# Patient Record
Sex: Female | Born: 1960 | Race: White | Hispanic: No | Marital: Married | Smoking: Never smoker
Health system: Southern US, Community
[De-identification: ages and names within clinical notes are randomized; demographics above are authoritative.]

---

## 1983-06-20 HISTORY — PX: APPENDECTOMY: SHX54

## 1990-06-19 HISTORY — PX: ABDOMINAL HYSTERECTOMY: SHX81

## 1990-06-19 HISTORY — PX: BILATERAL SALPINGOOPHORECTOMY: SHX1223

## 1998-08-10 ENCOUNTER — Encounter: Payer: Self-pay | Admitting: Family Medicine

## 1998-08-10 LAB — CONVERTED CEMR LAB: Pap Smear: NORMAL

## 2006-08-14 ENCOUNTER — Ambulatory Visit: Payer: Self-pay | Admitting: Family Medicine

## 2007-03-12 ENCOUNTER — Encounter: Payer: Self-pay | Admitting: Family Medicine

## 2007-03-12 DIAGNOSIS — F329 Major depressive disorder, single episode, unspecified: Secondary | ICD-10-CM

## 2007-03-12 DIAGNOSIS — F3289 Other specified depressive episodes: Secondary | ICD-10-CM | POA: Insufficient documentation

## 2007-03-12 DIAGNOSIS — K5730 Diverticulosis of large intestine without perforation or abscess without bleeding: Secondary | ICD-10-CM | POA: Insufficient documentation

## 2007-03-25 ENCOUNTER — Ambulatory Visit: Payer: Self-pay | Admitting: Family Medicine

## 2007-03-25 DIAGNOSIS — K5732 Diverticulitis of large intestine without perforation or abscess without bleeding: Secondary | ICD-10-CM

## 2007-03-25 LAB — CONVERTED CEMR LAB
ALT: 20 units/L (ref 0–35)
BUN: 15 mg/dL (ref 6–23)
Basophils Absolute: 0 10*3/uL (ref 0.0–0.1)
Calcium: 9.3 mg/dL (ref 8.4–10.5)
Chloride: 106 meq/L (ref 96–112)
Eosinophils Absolute: 0.3 10*3/uL (ref 0.0–0.6)
Eosinophils Relative: 5.2 % — ABNORMAL HIGH (ref 0.0–5.0)
GFR calc Af Amer: 115 mL/min
GFR calc non Af Amer: 95 mL/min
HDL: 45 mg/dL (ref >39.0)
Lymphocytes Relative: 33.1 % (ref 12.0–46.0)
MCHC: 34.6 g/dL (ref 30.0–36.0)
MCV: 87.5 fL (ref 78.0–100.0)
Monocytes Relative: 11.2 % — ABNORMAL HIGH (ref 3.0–11.0)
Neutro Abs: 3.3 10*3/uL (ref 1.4–7.7)
Platelets: 318 10*3/uL (ref 150–400)
RBC: 4.42 M/uL (ref 3.87–5.11)
TSH: 1.52 microintl units/mL (ref 0.35–5.50)
Total CHOL/HDL Ratio: 3.4
Triglycerides: 43 mg/dL (ref 0–149)
WBC: 6.4 10*3/uL (ref 4.5–10.5)

## 2007-07-22 ENCOUNTER — Other Ambulatory Visit: Admission: RE | Admit: 2007-07-22 | Discharge: 2007-07-22 | Payer: Self-pay | Admitting: Family Medicine

## 2007-07-22 ENCOUNTER — Ambulatory Visit: Payer: Self-pay | Admitting: Family Medicine

## 2007-07-22 DIAGNOSIS — R030 Elevated blood-pressure reading, without diagnosis of hypertension: Secondary | ICD-10-CM

## 2007-07-22 LAB — CONVERTED CEMR LAB
Pap Smear: NORMAL
Pap Smear: NORMAL

## 2007-07-25 ENCOUNTER — Encounter (INDEPENDENT_AMBULATORY_CARE_PROVIDER_SITE_OTHER): Payer: Self-pay | Admitting: *Deleted

## 2007-07-30 ENCOUNTER — Ambulatory Visit: Payer: Self-pay | Admitting: Family Medicine

## 2007-08-06 ENCOUNTER — Ambulatory Visit: Payer: Self-pay | Admitting: Family Medicine

## 2007-08-13 ENCOUNTER — Ambulatory Visit: Payer: Self-pay | Admitting: Family Medicine

## 2007-08-23 ENCOUNTER — Ambulatory Visit: Payer: Self-pay | Admitting: Family Medicine

## 2007-08-23 DIAGNOSIS — I10 Essential (primary) hypertension: Secondary | ICD-10-CM | POA: Insufficient documentation

## 2007-09-13 ENCOUNTER — Ambulatory Visit: Payer: Self-pay | Admitting: Family Medicine

## 2007-09-14 LAB — CONVERTED CEMR LAB
CO2: 30 meq/L (ref 19–32)
Calcium: 9.4 mg/dL (ref 8.4–10.5)
Creatinine, Ser: 0.8 mg/dL (ref 0.4–1.2)
GFR calc Af Amer: 99 mL/min
Sodium: 141 meq/L (ref 135–145)

## 2009-08-31 ENCOUNTER — Ambulatory Visit: Payer: Self-pay | Admitting: Family Medicine

## 2009-08-31 DIAGNOSIS — R142 Eructation: Secondary | ICD-10-CM

## 2009-08-31 DIAGNOSIS — R141 Gas pain: Secondary | ICD-10-CM | POA: Insufficient documentation

## 2009-08-31 DIAGNOSIS — R143 Flatulence: Secondary | ICD-10-CM

## 2009-10-12 ENCOUNTER — Ambulatory Visit: Payer: Self-pay | Admitting: Family Medicine

## 2010-01-20 ENCOUNTER — Encounter (INDEPENDENT_AMBULATORY_CARE_PROVIDER_SITE_OTHER): Payer: Self-pay | Admitting: *Deleted

## 2010-05-08 LAB — HM PAP SMEAR

## 2010-07-19 NOTE — Assessment & Plan Note (Signed)
Summary: CHECK BP,SWOLLEN STOMACH/CLE   Vital Signs:  Patient profile:   50 year old female Height:      60.5 inches Weight:      145.75 pounds BMI:     28.10 Temp:     98.5 degrees F oral Pulse rate:   76 / minute Pulse rhythm:   regular BP sitting:   170 / 100  (left arm) Cuff size:   regular  Vitals Entered By: Sydell Axon LPN (August 31, 2009 8:30 AM) CC: BP has been elevated and swollen stomach, stopped her Lisinipril because she ran out and lost her job   History of Present Illness: Pt here with her mother for Htn. Lost her job so stopped her BP medicvation due to lack of money. She had had a cough from her Lisinopril and would just as well not go back top that medication. Her mother is on multiple meds, the only one she knows being generic Coreg.  The pt also c/o her stomach becoming swollen as the day goes on. She has no edema in the legs and has no SOB with the swelling. She drinks 2 glasses of milk a day asnd eats yogurt, not much cheese or ice cream. She has no gas and no cramping.  Problems Prior to Update: 1)  Hypertension, Benign Essential  (ICD-401.1) 2)  Health Maintenance Exam  (ICD-V70.0) 3)  Elevated Blood Pressure Without Diagnosis of Hypertension  (ICD-796.2) 4)  Diverticulitis, Colon w/o Hem  (ICD-562.11) 5)  Depression  (ICD-311) 6)  Menopause, Surgical, (ENDOMETRIOSIS)  (ICD-627.4) 7)  Diverticular Disease  (ICD-562.10) 8)  Hypercholesterolemia, Mild 234/ldl 144  (ICD-272.0)  Medications Prior to Update: 1)  Calcium 2)  Lisinopril 2.5 Mg  Tabs (Lisinopril) .... One Tab By Mouth Hs  Allergies: 1)  ! Keflex (Cephalexin) 2)  ! Sulfa  Physical Exam  General:  Well-developed,well-nourished,in no acute distress; alert,appropriate and cooperative throughout examination, looks healthy and comfortablae when seen.  Head:  Normocephalic and atraumatic without obvious abnormalities. No apparent alopecia or balding. Eyes:  Conjunctiva clear bilaterally.  Ears:   External ear exam shows no significant lesions or deformities.  Otoscopic examination reveals clear canals, tympanic membranes are intact bilaterally without bulging, retraction, inflammation or discharge. Hearing is grossly normal bilaterally. Nose:  External nasal examination shows no deformity or inflammation. Nasal mucosa are pink and moist without lesions or exudates. Mouth:  Oral mucosa and oropharynx without lesions or exudates.  Teeth in good repair. Neck:  No deformities, masses, or tenderness noted. Chest Wall:  No deformities, masses, or tenderness noted. Lungs:  Normal respiratory effort, chest expands symmetrically. Lungs are clear to auscultation, no crackles or wheezes. Heart:  Normal rate and regular rhythm. S1 and S2 normal without gallop, murmur, click, rub or other extra sounds. Abdomen:  Bowel sounds positive,abdomen soft and non-tender without masses, organomegaly or hernias noted. Tympanitic throughout with mild ventral hernia w/o defect felt.   Impression & Recommendations:  Problem # 1:  HYPERTENSION, BENIGN ESSENTIAL (ICD-401.1) Assessment Deteriorated High off medication. Will try Metoprolol 25mg  Succ at night. Recheck 6 weeks. The following medications were removed from the medication list:    Lisinopril 2.5 Mg Tabs (Lisinopril) ..... One tab by mouth hs Her updated medication list for this problem includes:    Metoprolol Succinate 25 Mg Xr24h-tab (Metoprolol succinate) ..... One tab by mouth at night  BP today: 170/100 Prior BP: 140/80 (08/23/2007)  Labs Reviewed: K+: 4.0 (09/13/2007) Creat: : 0.8 (09/13/2007)   Chol: 152 (  03/25/2007)   HDL: 45.0 (03/25/2007)   LDL: 98 (03/25/2007)   TG: 43 (03/25/2007)  Problem # 2:  ABDOMINAL DISTENSION (ICD-787.3) Assessment: New Trial of Mylanta and avoiding Lactose.  Complete Medication List: 1)  Calcium  2)  Metoprolol Succinate 25 Mg Xr24h-tab (Metoprolol succinate) .... One tab by mouth at night  Patient  Instructions: 1)  RTC 6 wks for BP check and abd swelling 2)  Start Mylanta with swelling and avoid milk and milk products. Prescriptions: METOPROLOL SUCCINATE 25 MG XR24H-TAB (METOPROLOL SUCCINATE) one tab by mouth at night  #30 x 12   Entered and Authorized by:   Shaune Leeks MD   Signed by:   Shaune Leeks MD on 08/31/2009   Method used:   Electronically to        [facility] [address] (retail)       [address]       [place], [place]  [address]       Ph: [phone]       Fax: [phone]   RxID:   [id number]   Current Allergies (reviewed today): ! KEFLEX (CEPHALEXIN) ! SULFA

## 2010-07-19 NOTE — Letter (Signed)
Summary: Nadara Eaton letter  [facility]  [address] [place], [place] [address]   Phone: [phone]  Fax: [phone]       01/20/2010 MRN: [id number]  Markitta Vanhoesen [address] [place], [place]  [address]  Dear Ms. [facility] [place] and [place] announce the retirement of Arta Silence, M.D., from full-time practice at the [facility] office effective December 16, 2009 and his plans of returning part-time.  It is important to Dr. Hetty Ely and to our practice that you understand that [facility] has seven physicians in our office for your health care needs.  We will continue to offer the same exceptional care that you have today.    Dr. Hetty Ely has spoken to many of you about his plans for retirement and returning part-time in the fall.   We will continue to work with you through the transition to schedule appointments for you in the office and meet the high standards that [facility] is committed to.   Again, it is with great pleasure that we share the news that Dr. Hetty Ely will return to [facility] in October of 2011 with a reduced schedule.    If you have any questions, or would like to request an appointment with one of our physicians, please call us at [phone] and press the option for Scheduling an appointment.  We take pleasure in providing you with excellent patient care and look forward to seeing you at your next office visit.  Our [facility]icians are:  Tillman Abide, M.D. Laurita Quint, M.D. Roxy Manns, M.D. Kerby Nora, M.D. Hannah Beat, M.D. Ruthe Mannan, M.D. We proudly welcomed Raechel Ache, M.D. and Eustaquio Boyden, M.D. to the practice in July/August 2011.  Sincerely,  [facility]

## 2010-07-19 NOTE — Assessment & Plan Note (Signed)
Summary: 6WK F/U FOR BP CHECK AND ABD SWELLING / LFW   Vital Signs:  Patient profile:   50 year old female Weight:      143.50 pounds Temp:     98.8 degrees F oral Pulse rate:   60 / minute Pulse rhythm:   regular BP sitting:   130 / 88  (left arm) Cuff size:   regular  Vitals Entered By: Sydell Axon LPN (October 12, 2009 10:25 AM) CC: 6 week follow-up on BP and abd swelling   History of Present Illness: Pt here for BP followup. She had some dizziness with the Metoprolol but that has gotten better. She still has some abd bloating that is not as bad, not burping much (not daily), not passing gas. She has had off and on pain in the stomach but nothing regular. Diet consists of Total whole grain or Fiber One for brfst, drinks OJ, at lunch often has [place] sandwich with wheat bread at night meat and veggies, water and cranberry juice during the day.   Problems Prior to Update: 1)  Abdominal Distension  (ICD-787.3) 2)  Hypertension, Benign Essential  (ICD-401.1) 3)  Health Maintenance Exam  (ICD-V70.0) 4)  Elevated Blood Pressure Without Diagnosis of Hypertension  (ICD-796.2) 5)  Diverticulitis, Colon w/o Hem  (ICD-562.11) 6)  Depression  (ICD-311) 7)  Menopause, Surgical, (ENDOMETRIOSIS)  (ICD-627.4) 8)  Diverticular Disease  (ICD-562.10) 9)  Hypercholesterolemia, Mild 234/ldl 144  (ICD-272.0)  Medications Prior to Update: 1)  Calcium 2)  Metoprolol Succinate 25 Mg Xr24h-Tab (Metoprolol Succinate) .... One Tab By Mouth At Night  Allergies: 1)  ! Keflex (Cephalexin) 2)  ! Sulfa  Physical Exam  General:  Well-developed,well-nourished,in no acute distress; alert,appropriate and cooperative throughout examination, looks healthy and comfortablae when seen.  Head:  Normocephalic and atraumatic without obvious abnormalities. No apparent alopecia or balding. Eyes:  Conjunctiva clear bilaterally.  Ears:  External ear exam shows no significant lesions or deformities.  Otoscopic  examination reveals clear canals, tympanic membranes are intact bilaterally without bulging, retraction, inflammation or discharge. Hearing is grossly normal bilaterally. Nose:  External nasal examination shows no deformity or inflammation. Nasal mucosa are pink and moist without lesions or exudates. Mouth:  Oral mucosa and oropharynx without lesions or exudates.  Teeth in good repair. Neck:  No deformities, masses, or tenderness noted. Lungs:  Normal respiratory effort, chest expands symmetrically. Lungs are clear to auscultation, no crackles or wheezes. Heart:  Normal rate and regular rhythm. S1 and S2 normal without gallop, murmur, click, rub or other extra sounds. Abdomen:  Bowel sounds positive,abdomen soft and non-tender without masses, organomegaly or hernias noted. Less disrtended than last visit but still slightly so. Tympanitic throughout with mild ventral hernia w/o defect felt.   Impression & Recommendations:  Problem # 1:  HYPERTENSION, BENIGN ESSENTIAL (ICD-401.1) Assessment Improved  Better, not great. Will continue to follow. See  back in late Fall. Her updated medication list for this problem includes:    Metoprolol Succinate 25 Mg Xr24h-tab (Metoprolol succinate) ..... One tab by mouth at night  BP today: 130/88 Prior BP: 170/100 (08/31/2009)  Labs Reviewed: K+: 4.0 (09/13/2007) Creat: : 0.8 (09/13/2007)   Chol: 152 (03/25/2007)   HDL: 45.0 (03/25/2007)   LDL: 98 (03/25/2007)   TG: 43 (03/25/2007)  Problem # 2:  ABDOMINAL DISTENSION (ICD-787.3) Assessment: Improved Mildly improved. Cont to experiment with foods as discussed. Poss start with eating just one food for a day or two and slowly adding as bloating  allows.  Complete Medication List: 1)  Calcium  2)  Metoprolol Succinate 25 Mg Xr24h-tab (Metoprolol succinate) .... One tab by mouth at night  Patient Instructions: 1)  RTC late Fall.  Current Allergies (reviewed today): ! KEFLEX (CEPHALEXIN) ! SULFA

## 2010-09-23 ENCOUNTER — Other Ambulatory Visit: Payer: Self-pay | Admitting: Family Medicine

## 2010-09-26 NOTE — Telephone Encounter (Signed)
Rx called to pharmacy

## 2012-02-06 ENCOUNTER — Ambulatory Visit (INDEPENDENT_AMBULATORY_CARE_PROVIDER_SITE_OTHER): Payer: BC Managed Care – PPO | Admitting: Internal Medicine

## 2012-02-06 ENCOUNTER — Encounter: Payer: Self-pay | Admitting: Internal Medicine

## 2012-02-06 VITALS — BP 132/84 | HR 78 | Temp 98.0°F | Resp 14 | Ht 60.5 in | Wt 145.8 lb

## 2012-02-06 DIAGNOSIS — R140 Abdominal distension (gaseous): Secondary | ICD-10-CM

## 2012-02-06 DIAGNOSIS — E663 Overweight: Secondary | ICD-10-CM | POA: Insufficient documentation

## 2012-02-06 DIAGNOSIS — Z6825 Body mass index (BMI) 25.0-25.9, adult: Secondary | ICD-10-CM

## 2012-02-06 DIAGNOSIS — R142 Eructation: Secondary | ICD-10-CM

## 2012-02-06 DIAGNOSIS — Z1239 Encounter for other screening for malignant neoplasm of breast: Secondary | ICD-10-CM

## 2012-02-06 LAB — HM COLONOSCOPY

## 2012-02-06 NOTE — Patient Instructions (Addendum)
Consider a Low Glycemic Index Diet and eating 6 smaller meals daily .  This frequent feeding stimulates your metabolism and the lower glycemic index foods will lower your blood sugars:   This is an example of my daily  "Low GI"  Diet:  All of the foods can be found at grocery stores and in bulk at [employer]Js  club   7 AM Breakfast:  Low carbohydrate Protein  Shakes (I recommend the EAS AdvantEdge "Carb Control" shakes  Or the low carb shakes by Atkins.   Both are available everywhere:  In  cases at BJs  Or in 4 packs at grocery stores and pharmacies  2.5 carbs  (Alternative is  a toasted Arnold's Sandwhich Thin w/ peanut butter, a "Bagel Thin" with cream cheese and salmon) or  a scrambled egg burrito made with a low carb tortilla .  Avoid cereal and bananas, oatmeal too unless the old fashioned kind that takes 30-40 minutes to prepare.  the rest is overly processed, has minimal fiber, and loaded with carbohydrates!   10 AM: Protein bar by Atkins (the snack size, under 200 cal.  There are many varieties , available widely again or in bulk in limited varieties at BJs)  Other so called "protein bars" tend to be loaded with carbohydrates.  Remember, in food advertising, the word "energy" is synonymous for " carbohydrate."  Lunch: sandwich of turkey, (or any lunchmeat or canned tuna), fresh avocado and cheese on a lower carbohydrate pita bread, flatbread, or tortilla . Ok to use mayonnaise. The bread is the only source or carbohydrate that can be decreased (Joseph's makes a pita bread and a flat bread  Are 50 cal and 4 net carbs ; Toufayan makes a low carb flatbread 100 cal and 9 net carbs  and  Mission makes a low carb whole wheat tortilla  210 cal and 6 net carbs)  3 PM:  Mid day :  Another protein bar,  Or a  cheese stick (100 cal, 0 carbs),  Or 1 ounce of  almonds, walnuts, pistachios, pecans, peanuts,  Macadamia nuts. Or a Dannon light n Fit greek yogurt, 80 cal 8 net carbs . Avoid "granola"; the dried  cranberries and raisins are loaded with carbohydrates.    6 PM  Dinner:  "mean and green:"  Meat/chicken/fish or a high protein legume; , with a green salad, and a low GI  Veggie (broccoli, cauliflower, green beans, spinach, brussel sprouts. Lima beans) : Avoid "Low fat dressings, [place] and [place]! They are loaded with sugar! Instead use ranch, vinagrette,  Blue cheese, etc  9 PM snack : Breyer's "low carb" fudgsicle or  ice cream bar (Carb Smart line), or  Weight Watcher's ice cream bar , or anouther "no sugar added" ice cream; or another protein shake or a serving of fresh fruit with whipped cream (Avoid bananas, pineapple, grapes  and watermelon on a regular basis because they are high in sugar)   Remember that snack Substitutions should be less than 15 to 20 carbs  Per serving. Remember to subtract fiber grams to get the "net carbs." 

## 2012-02-06 NOTE — Progress Notes (Signed)
Patient ID: Carrie Swanson, female   DOB: 1960-07-20, 51 y.o.   MRN: [id number]  Patient Active Problem List  Diagnosis  . HYPERTENSION, BENIGN ESSENTIAL  . DIVERTICULAR DISEASE  . ABDOMINAL DISTENSION  . Overweight (BMI 25.0-29.9)    Subjective:  CC:   Chief Complaint  Patient presents with  . Establish Care    HPI:   Carrie Swanson is a 51 y.o. female who presents as a new patient to establish primary care with the chief complaint of Overweight.  Abdominal bloating, becomes  worse by nightfall.  History of TAH/BSO secondary to endometriosis.  Not constipated. Moves bowels ,  Occasionally hard stools.  No regular pain relievers.,  Takes calcium daily and prn otc prilosec for reflux for the past year or two.,  Can go 3 days without it before symptoms return .  Occasional stabbing pain in the left lower quadrant, not daily. Weight has been stable.  Has been exercising using a treadmill 5 days per week for the past 4 months.    History reviewed. No pertinent past medical history.  Past Surgical History  Procedure Date  . Abdominal hysterectomy 1992    endometriosis  . Bilateral salpingoophorectomy 1992  . Appendectomy 1985  . Cesarean section     Family History  Problem Relation Age of Onset  . Hypertension Mother   . Heart disease Mother   . Mental illness Father     alzheimer  . Heart disease Father   . Diabetes Sister   . COPD Sister   . Cancer Paternal Grandmother     leukemia    History   Social History  . Marital Status: Married    Spouse Name: N/A    Number of Children: N/A  . Years of Education: N/A   Occupational History  . Not on file.   Social History Main Topics  . Smoking status: Never Smoker   . Smokeless tobacco: Never Used  . Alcohol Use: No  . Drug Use: No  . Sexually Active: Not on file   Other Topics Concern  . Not on file   Social History Narrative  . No narrative on file    Allergies  Allergen Reactions  . Cephalexin    REACTION: Hives  . Lisinopril     Causes cough  . Sulfonamide Derivatives     REACTION: Hives      Review of Systems:   The remainder of the review of systems was negative except those addressed in the HPI.     Objective:  BP 132/84  Pulse 78  Temp 98 F (36.7 C) (Oral)  Resp 14  Ht 5' 0.5" (1.537 m)  Wt 145 lb 12 oz (66.112 kg)  BMI 28.00 kg/m2  SpO2 93%  General appearance: alert, cooperative and appears stated age Ears: normal TM's and external ear canals both ears Throat: lips, mucosa, and tongue normal; teeth and gums normal Neck: no adenopathy, no carotid bruit, supple, symmetrical, trachea midline and thyroid not enlarged, symmetric, no tenderness/mass/nodules Back: symmetric, no curvature. ROM normal. No CVA tenderness. Lungs: clear to auscultation bilaterally Heart: regular rate and rhythm, S1, S2 normal, no murmur, click, rub or gallop Abdomen: soft, non-tender; bowel sounds normal; no masses,  no organomegaly Pulses: 2+ and symmetric Skin: Skin color, texture, turgor normal. No rashes or lesions Lymph nodes: Cervical, supraclavicular, and axillary nodes normal.  Assessment and Plan:  ABDOMINAL DISTENSION Her exam is consistent with central adiposity, but since she has LUQ pain  on exam and will order an abdominal ultrasound to rule out splenomegaly   Updated Medication List Outpatient Encounter Prescriptions as of 02/06/2012  Medication Sig Dispense Refill  . Calcium Carb-Cholecalciferol (CALCIUM + D3) 600-200 MG-UNIT TABS Take by mouth.      . DISCONTD: metoprolol succinate (TOPROL-XL) 25 MG 24 hr tablet take 1 tablet by mouth every evening  30 tablet  0     Orders Placed This Encounter  Procedures  . MM Digital Screening  . US Abdomen Complete    Return in about 3 months (around 05/08/2012).

## 2012-02-06 NOTE — Assessment & Plan Note (Signed)
Her exam is consistent with central adiposity, but since she has LUQ pain on exam and will order an abdominal ultrasound to rule out splenomegaly

## 2012-02-07 ENCOUNTER — Telehealth: Payer: Self-pay | Admitting: Internal Medicine

## 2012-02-07 ENCOUNTER — Ambulatory Visit: Payer: Self-pay | Admitting: Internal Medicine

## 2012-02-07 DIAGNOSIS — K7689 Other specified diseases of liver: Secondary | ICD-10-CM

## 2012-02-07 NOTE — Telephone Encounter (Signed)
Her abdominal ultrasound was normal except for a small (1 cm) nodule in the liver which they could not really characterize as a cyst or a mass .  They are recommending an MRI of the abdomen for a better idea of whether it is anything other than a cyst. I will order.

## 2012-02-08 NOTE — Telephone Encounter (Signed)
Patient notified

## 2012-02-14 ENCOUNTER — Other Ambulatory Visit: Payer: Self-pay | Admitting: Internal Medicine

## 2012-02-14 DIAGNOSIS — Z1211 Encounter for screening for malignant neoplasm of colon: Secondary | ICD-10-CM

## 2012-02-15 ENCOUNTER — Ambulatory Visit: Payer: Self-pay | Admitting: Internal Medicine

## 2012-02-15 ENCOUNTER — Other Ambulatory Visit: Payer: BC Managed Care – PPO

## 2012-02-15 DIAGNOSIS — Z1211 Encounter for screening for malignant neoplasm of colon: Secondary | ICD-10-CM

## 2012-02-15 LAB — FECAL OCCULT BLOOD, IMMUNOCHEMICAL: Fecal Occult Bld: NEGATIVE

## 2012-02-16 ENCOUNTER — Encounter: Payer: Self-pay | Admitting: Internal Medicine

## 2012-02-16 ENCOUNTER — Telehealth: Payer: Self-pay | Admitting: Internal Medicine

## 2012-02-16 NOTE — Telephone Encounter (Signed)
Pt called to get results of mri that was done yesterday [facility]

## 2012-02-16 NOTE — Telephone Encounter (Signed)
We have not received results yet, we will call patient when we receive them.

## 2012-02-20 ENCOUNTER — Telehealth: Payer: Self-pay | Admitting: Internal Medicine

## 2012-02-20 NOTE — Telephone Encounter (Signed)
Left message asking patient to return call. 

## 2012-02-20 NOTE — Telephone Encounter (Signed)
The MIR of her liver showed that the nodule we saw on the CT scan is just a cyst.  Nothing to worry about.  No further workup required.

## 2012-02-26 ENCOUNTER — Ambulatory Visit: Payer: Self-pay | Admitting: Internal Medicine

## 2012-02-27 ENCOUNTER — Ambulatory Visit: Payer: Self-pay | Admitting: Internal Medicine

## 2012-02-28 ENCOUNTER — Telehealth: Payer: Self-pay | Admitting: Internal Medicine

## 2012-02-28 NOTE — Telephone Encounter (Signed)
Left message on cell # asking patient to call back.

## 2012-02-28 NOTE — Telephone Encounter (Signed)
Her mammogram was read as abnormal on the right, and  They are requesting images to make sure it is just a skin tag.  Has she been contacted?

## 2012-02-28 NOTE — Telephone Encounter (Signed)
Patient notified.  She stated they contacted her this morning.

## 2012-02-29 ENCOUNTER — Telehealth: Payer: Self-pay | Admitting: Internal Medicine

## 2012-02-29 ENCOUNTER — Ambulatory Visit: Payer: Self-pay | Admitting: Internal Medicine

## 2012-02-29 NOTE — Telephone Encounter (Signed)
The area of concern on the right breast was indeed a skin tag. Nothing to worry about. Repeat annual mammogram in 1 year.

## 2012-03-01 NOTE — Telephone Encounter (Signed)
Patient is aware 

## 2012-03-07 ENCOUNTER — Encounter: Payer: Self-pay | Admitting: Internal Medicine

## 2012-03-08 ENCOUNTER — Encounter: Payer: Self-pay | Admitting: Internal Medicine

## 2012-05-02 ENCOUNTER — Other Ambulatory Visit: Payer: Self-pay | Admitting: *Deleted

## 2012-05-03 ENCOUNTER — Other Ambulatory Visit (INDEPENDENT_AMBULATORY_CARE_PROVIDER_SITE_OTHER): Payer: BC Managed Care – PPO

## 2012-05-03 DIAGNOSIS — E670 Hypervitaminosis A: Secondary | ICD-10-CM

## 2012-05-03 DIAGNOSIS — Z13220 Encounter for screening for lipoid disorders: Secondary | ICD-10-CM

## 2012-05-03 DIAGNOSIS — E663 Overweight: Secondary | ICD-10-CM

## 2012-05-03 LAB — COMPREHENSIVE METABOLIC PANEL
BUN: 14 mg/dL (ref 6–23)
CO2: 29 mEq/L (ref 19–32)
Creatinine, Ser: 0.8 mg/dL (ref 0.4–1.2)
GFR: 83.56 mL/min (ref >60.00)
Glucose, Bld: 101 mg/dL — ABNORMAL HIGH (ref 70–99)
Total Bilirubin: 0.7 mg/dL (ref 0.3–1.2)

## 2012-05-03 LAB — LIPID PANEL
HDL: 54.2 mg/dL (ref >39.00)
Triglycerides: 88 mg/dL (ref 0.0–149.0)

## 2012-05-03 LAB — TSH: TSH: 1.86 u[IU]/mL (ref 0.35–5.50)

## 2012-05-03 LAB — HEMOGLOBIN A1C: Hgb A1c MFr Bld: 6.1 % (ref 4.6–6.5)

## 2012-05-08 ENCOUNTER — Encounter: Payer: Self-pay | Admitting: Internal Medicine

## 2012-05-08 ENCOUNTER — Ambulatory Visit (INDEPENDENT_AMBULATORY_CARE_PROVIDER_SITE_OTHER): Payer: BC Managed Care – PPO | Admitting: Internal Medicine

## 2012-05-08 VITALS — BP 122/68 | HR 82 | Temp 98.4°F | Resp 12 | Ht 60.5 in | Wt 141.2 lb

## 2012-05-08 DIAGNOSIS — Z0000 Encounter for general adult medical examination without abnormal findings: Secondary | ICD-10-CM

## 2012-05-08 DIAGNOSIS — E663 Overweight: Secondary | ICD-10-CM

## 2012-05-08 DIAGNOSIS — Z124 Encounter for screening for malignant neoplasm of cervix: Secondary | ICD-10-CM

## 2012-05-08 NOTE — Patient Instructions (Addendum)
This is  my version of a  "Low GI"  Diet:  All of the foods can be found at grocery stores and in bulk at [employer].  The Atkins protein bars and shakes are available in more varieties at [employer], [employer] and [employer].     7 AM Breakfast:  Low carbohydrate Protein  Shakes (I recommend the EAS AdvantEdge "Carb Control" shakes  Or the low carb shakes by Atkins.   Both are available everywhere:  In  cases at BJs  Or in 4 packs at grocery stores and pharmacies  2.5 carbs  (Alternative is  a toasted Arnold's Sandwhich Thin w/ peanut butter, a "Bagel Thin" with cream cheese and salmon) or  a scrambled egg burrito made with a low carb tortilla .  Avoid cereal and bananas, oatmeal too unless you are cooking the old fashioned kind that takes 30-40 minutes to prepare.  the rest is overly processed, has minimal fiber, and is loaded with carbohydrates!   10 AM: Protein bar by Atkins (the snack size, under 200 cal).  There are many varieties , available widely again or in bulk in limited varieties at BJs)  Other so called "protein bars" tend to be loaded with carbohydrates.  Remember, in food advertising, the word "energy" is synonymous for " carbohydrate."  Lunch: sandwich of turkey, (or any lunchmeat, grilled meat or canned tuna), fresh avocado, mayonnaise  and cheese on a lower carbohydrate pita bread, flatbread, or tortilla . Ok to use regular mayonnaise. The bread is the only source or carbohydrate that can be decreased (Joseph's makes a pita bread and a flat bread that are 50 cal and 4 net carbs ; Toufayan makes a low carb flatbread that's 100 cal and 9 net carbs  and  Mission makes a low carb whole wheat tortilla  That is 210 cal and 6 net carbs)  3 PM:  Mid day :  Another protein bar,  Or a  cheese stick (100 cal, 0 carbs),  Or 1 ounce of  almonds, walnuts, pistachios, pecans, peanuts,  Macadamia nuts. Or a Dannon light n Fit greek yogurt, 80 cal 8 net carbs . Avoid "granola"; the dried cranberries and  raisins are loaded with carbohydrates. Mixed nuts ok if no raisins or cranberries or dried fruit.      6 PM  Dinner:  "mean and green:"  Meat/chicken/fish or a high protein legume; , with a green salad, and a low GI  Veggie (broccoli, cauliflower, green beans, spinach, brussel sprouts. Lima beans) : Avoid "Low fat dressings, as well as [place] and [place]! They are loaded with sugar! Instead use ranch, vinagrette,  Blue cheese, etc.  There is a low carb pasta by Dreamfield's available at [employer] grocery that is acceptable and tastes great. Try Michel Angel's chicken piccata over low carb pasta. The chicken dish is 0 carbs, and can be found in frozen section at BJs and [employer]. Also try Aaron Sanchez's "Carnitas" (pulled pork, no sauce,  0 carbs) and his pot roast.   both are in the refrigerated section at BJs   9 PM snack : Breyer's "low carb" fudgsicle or  ice cream bar (Carb Smart line), or  Weight Watcher's ice cream bar , or another "no sugar added" ice cream;a serving of fresh berries/cherries with whipped cream (Avoid bananas, pineapple, grapes  and watermelon on a regular basis because they are high in sugar)   Remember that snack Substitutions should be less than 15 to   20 carbs  Per serving. Remember to subtract fiber grams and sugar alcohols to get the "net carbs."   

## 2012-05-08 NOTE — Progress Notes (Signed)
Patient ID: YVANNA VIDAS, female   DOB: Aug 10, 1960, 51 y.o.   MRN: [id number]  Subjective:     SU DUMA is a 51 y.o. female and is here for a comprehensive physical exam. The patient reports no problems. She is a medication auditor at [employer] and is concerned about job security due to economic pressures inflicted on the facility by [employer] coverage changes.  She denies insomnia, but is having frequent tension headaches  Which start in neck.     History   Social History  . Marital Status: Married    Spouse Name: N/A    Number of Children: N/A  . Years of Education: N/A   Occupational History  . Not on file.   Social History Main Topics  . Smoking status: Never Smoker   . Smokeless tobacco: Never Used  . Alcohol Use: No  . Drug Use: No  . Sexually Active: Not on file   Other Topics Concern  . Not on file   Social History Narrative  . No narrative on file   Health Maintenance  Topic Date Due  . Influenza Vaccine  02/18/2012  . Pap Smear  05/08/2013  . Mammogram  03/08/2014  . Tetanus/tdap  07/21/2017  . Colonoscopy  02/05/2022    The following portions of the patient's history were reviewed and updated as appropriate: allergies, current medications, past family history, past medical history, past social history, past surgical history and problem list.  Review of Systems A comprehensive review of systems was negative.   Objective:        BP 122/68  Pulse 82  Temp 98.4 F (36.9 C) (Oral)  Resp 12  Ht 5' 0.5" (1.537 m)  Wt 141 lb 4 oz (64.071 kg)  BMI 27.13 kg/m2  SpO2 94%  General Appearance:    Alert, cooperative, no distress, appears stated age  Head:    Normocephalic, without obvious abnormality, atraumatic  Eyes:    PERRL, conjunctiva/corneas clear, EOM's intact, fundi    benign, both eyes  Ears:    Normal TM's and external ear canals, both ears  Nose:   Nares normal, septum midline, mucosa normal, no drainage    or sinus tenderness    Throat:   Lips, mucosa, and tongue normal; teeth and gums normal  Neck:   Supple, symmetrical, trachea midline, no adenopathy;    thyroid:  no enlargement/tenderness/nodules; no carotid   bruit or JVD  Back:     Symmetric, no curvature, ROM normal, no CVA tenderness  Lungs:     Clear to auscultation bilaterally, respirations unlabored  Chest Wall:    No tenderness or deformity   Heart:    Regular rate and rhythm, S1 and S2 normal, no murmur, rub   or gallop  Breast Exam:    No tenderness, masses, or nipple abnormality  Abdomen:     Soft, non-tender, bowel sounds active all four quadrants,    no masses, no organomegaly     Extremities:   Extremities normal, atraumatic, no cyanosis or edema  Pulses:   2+ and symmetric all extremities  Skin:   Skin color, texture, turgor normal, no rashes or lesions  Lymph nodes:   Cervical, supraclavicular, and axillary nodes normal  Neurologic:   CNII-XII intact, normal strength, sensation and reflexes    throughout       Plan:     Routine general medical examination at a health care facility Breast and non gyn exam was normal  today.  Overweight (BMI 25.0-29.9) improving with exercise.  No signs of diabetes or hypothryoidism.    Updated Medication List Outpatient Encounter Prescriptions as of 05/08/2012  Medication Sig Dispense Refill  . Calcium Carb-Cholecalciferol (CALCIUM + D3) 600-200 MG-UNIT TABS Take by mouth.

## 2012-05-11 ENCOUNTER — Encounter: Payer: Self-pay | Admitting: Internal Medicine

## 2012-05-11 DIAGNOSIS — Z0000 Encounter for general adult medical examination without abnormal findings: Secondary | ICD-10-CM | POA: Insufficient documentation

## 2012-05-11 NOTE — Assessment & Plan Note (Signed)
Breast and non gyn exam was normal today.

## 2012-05-11 NOTE — Assessment & Plan Note (Signed)
improving with exercise.  No signs of diabetes or hypothryoidism.

## 2012-05-21 ENCOUNTER — Telehealth: Payer: Self-pay | Admitting: Internal Medicine

## 2012-05-21 NOTE — Telephone Encounter (Signed)
Patient Information:  Caller Name: Carrie Swanson  Phone: [phone]  Patient: Carrie Swanson  Gender: Female  DOB: 04-02-1961  Age: 51 Years  PCP: Duncan Dull (Adults only)  Pregnant: No   Symptoms  Reason For Call & Symptoms: Chills, Aches and pain in head, neck, back, legs, Mild sinus congestion  Reviewed Health History In EMR: Yes  Reviewed Medications In EMR: Yes  Reviewed Allergies In EMR: Yes  Reviewed Surgeries / Procedures: Yes  Date of Onset of Symptoms: 05/20/2012  Treatments Tried: Ibuprofen for pain  Treatments Tried Worked: Yes OB:  LMP: Unknown  Guideline(s) Used:  Headache  Influenza - Seasonal  Disposition Per Guideline:   Home Care  Reason For Disposition Reached:   Probable influenza with no complications and not HIGH RISK  Advice Given:  Treating the Symptoms of Flu  Fever, Muscle Aches, and Headache: For fever more than 101 F (38.3 C), muscle aches, and headaches, take acetaminophen every 4-6 hours (Adults 650 mg) OR ibuprofen every 6-8 hours (Adults 400-600 mg).  Hydrate: Drink extra liquids. If the air in your home is dry, use a humidifier.  Expected Course  : The fever lasts 2-3 days, the runny nose 5-10 days, and the cough 2-3 weeks.  Call Back If:  Fever lasts more than 3 days  Runny nose lasts more than 10 days  Cough lasts more than 3 weeks  You become short of breath or worse.  Office Follow Up:  Does the office need to follow up with this patient?: No  Instructions For The Office: N/A  RN Note:  Having Headache pain 4-7 on pain scale. Patient states she feels like she may be coming down with the flu.

## 2012-05-21 NOTE — Telephone Encounter (Signed)
Left detailed message on patient vm as instructed and advised patient to schedule appt to have rapid flu test done.

## 2012-05-21 NOTE — Telephone Encounter (Signed)
Unlikely to be the FLu unless she is having T > 100 but if she would like to come in for a rapid influenza test,  RN visit, I will review and if positive will call in Tamiflu

## 2012-05-21 NOTE — Telephone Encounter (Signed)
Routing nurse notes

## 2012-08-03 ENCOUNTER — Other Ambulatory Visit: Payer: Self-pay

## 2012-08-09 ENCOUNTER — Telehealth: Payer: Self-pay | Admitting: Internal Medicine

## 2012-08-09 NOTE — Telephone Encounter (Signed)
Pt states she was in for her physical in 11/13.  Pt received a bill and her insurance states they are not paying for the physical due to the code used.  Patient has already spoken to billing and they were not able to help her, stated that the code will have to be revised by Dr. Darrick Huntsman.  Please advise.

## 2013-04-24 ENCOUNTER — Other Ambulatory Visit: Payer: Self-pay

## 2013-05-13 ENCOUNTER — Ambulatory Visit (INDEPENDENT_AMBULATORY_CARE_PROVIDER_SITE_OTHER): Payer: BC Managed Care – PPO | Admitting: Internal Medicine

## 2013-05-13 ENCOUNTER — Encounter: Payer: Self-pay | Admitting: Internal Medicine

## 2013-05-13 VITALS — BP 138/80 | HR 63 | Temp 97.3°F | Resp 12 | Ht 61.0 in | Wt 135.8 lb

## 2013-05-13 DIAGNOSIS — Z9079 Acquired absence of other genital organ(s): Secondary | ICD-10-CM

## 2013-05-13 DIAGNOSIS — E785 Hyperlipidemia, unspecified: Secondary | ICD-10-CM

## 2013-05-13 DIAGNOSIS — Z13820 Encounter for screening for osteoporosis: Secondary | ICD-10-CM

## 2013-05-13 DIAGNOSIS — Z90710 Acquired absence of both cervix and uterus: Secondary | ICD-10-CM | POA: Insufficient documentation

## 2013-05-13 DIAGNOSIS — E559 Vitamin D deficiency, unspecified: Secondary | ICD-10-CM

## 2013-05-13 DIAGNOSIS — R5381 Other malaise: Secondary | ICD-10-CM

## 2013-05-13 DIAGNOSIS — Z0000 Encounter for general adult medical examination without abnormal findings: Secondary | ICD-10-CM

## 2013-05-13 DIAGNOSIS — E663 Overweight: Secondary | ICD-10-CM

## 2013-05-13 DIAGNOSIS — G43909 Migraine, unspecified, not intractable, without status migrainosus: Secondary | ICD-10-CM | POA: Insufficient documentation

## 2013-05-13 DIAGNOSIS — Z1211 Encounter for screening for malignant neoplasm of colon: Secondary | ICD-10-CM

## 2013-05-13 DIAGNOSIS — Z6825 Body mass index (BMI) 25.0-25.9, adult: Secondary | ICD-10-CM

## 2013-05-13 MED ORDER — AMITRIPTYLINE HCL 25 MG PO TABS: 25.0000 mg | ORAL_TABLET | Freq: Every day | ORAL | Status: DC

## 2013-05-13 MED ORDER — ELETRIPTAN HYDROBROMIDE 40 MG PO TABS: 40.0000 mg | ORAL_TABLET | ORAL | Status: DC | PRN

## 2013-05-13 NOTE — Patient Instructions (Addendum)
Trial of amitryptiline 25 mfg at bedtime for prevention of migraines.  Can increase to 50 mg if needed after 2 weeks.  Trial of relpax to take for migraine headaches.  One tablet at onset.  May repeat in 2 hours if headache retursn or does not resolve.  (max 2 doses/24 hr )   You had your annual  wellness exam today  We will schedule your bone density test soon.  Please use the stool kit to send [place] back a sample to test for blood.  This is your colon CA screening test.   Make an appt for fasting labs  We will contact you with the bloodwork results  3D mammogram due tin September

## 2013-05-13 NOTE — Progress Notes (Signed)
Pre-visit discussion using our clinic review tool. No additional management support is needed unless otherwise documented below in the visit note.  

## 2013-05-14 IMAGING — US ABDOMEN ULTRASOUND LIMITED
1 series · 13 of 25 positions shown · non-contrast
Comparison: none

REASON FOR EXAM: abd distention
COMMENTS:

PROCEDURE:     QUIRIJN - QUIRIJN ABDOMEN UPPER GENERAL  - February 07, 2012  [DATE]
RESULT:     Comparison: None
TECHNIQUE: Multiple gray-scale and color-flow Doppler images of the abdomen
are presented for review.

[Series 1: abdomen ultrasound limited · 0.31mm/px · 13 of 80 slices shown]
[im 1/80]
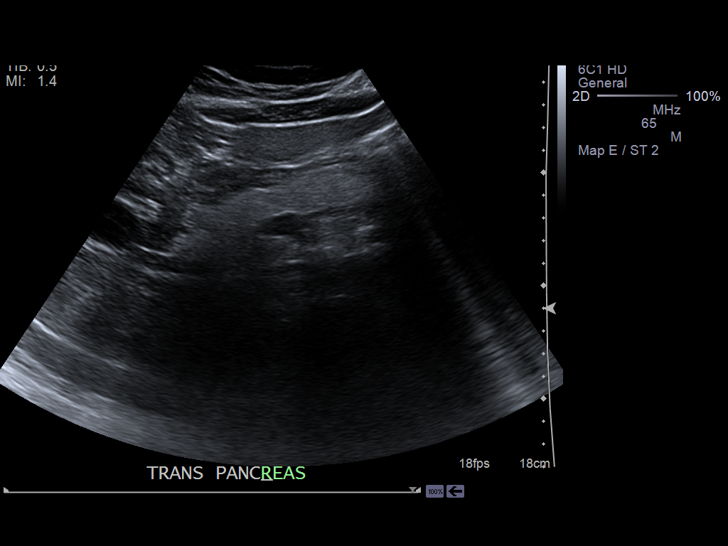
[im 7/80]
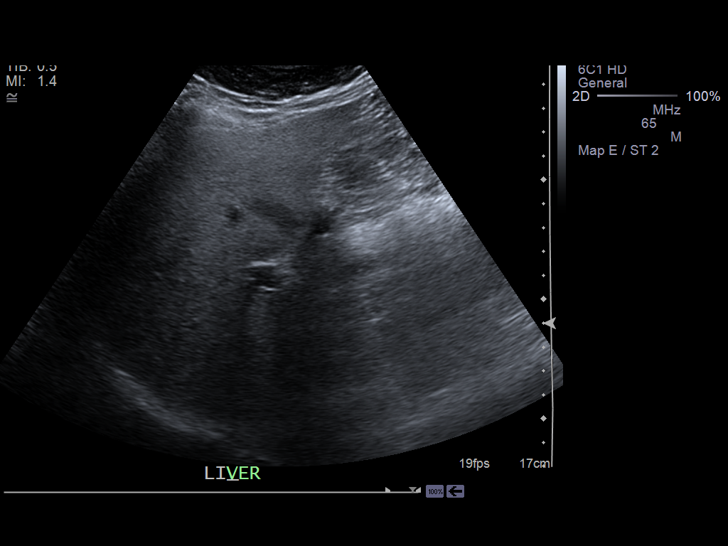
[im 14/80]
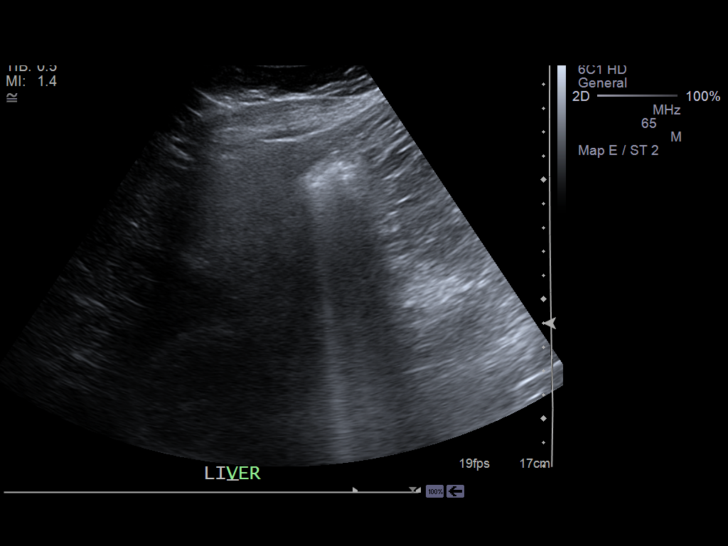
[im 20/80]
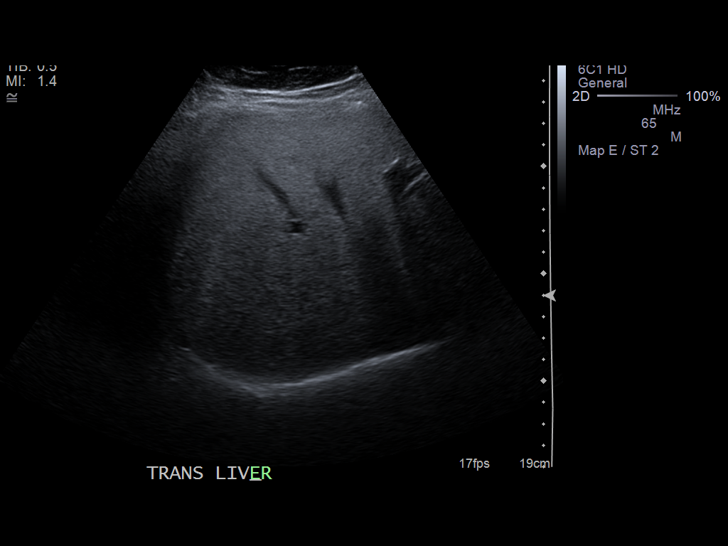
[im 27/80]
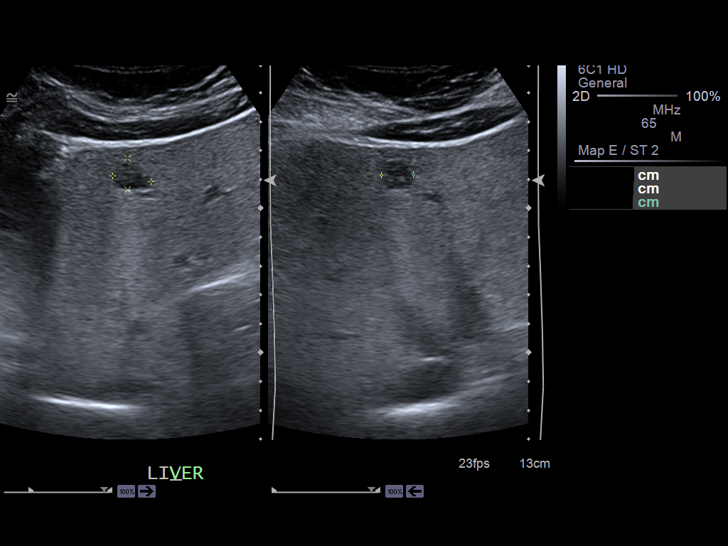
[im 33/80]
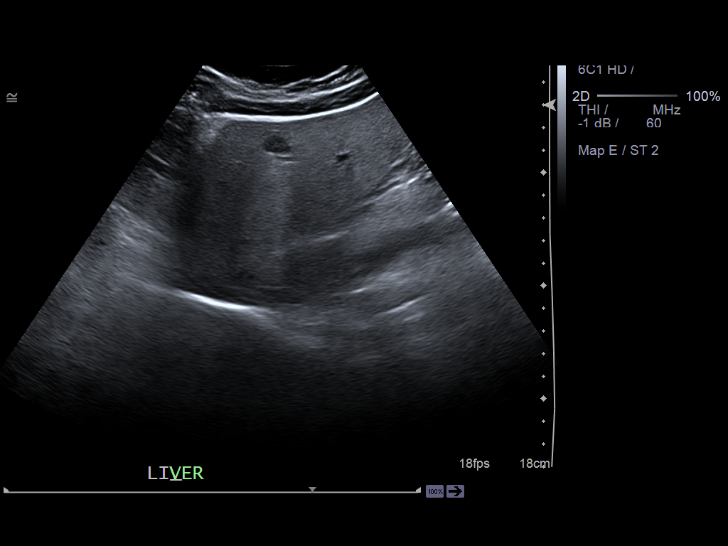
[im 40/80]
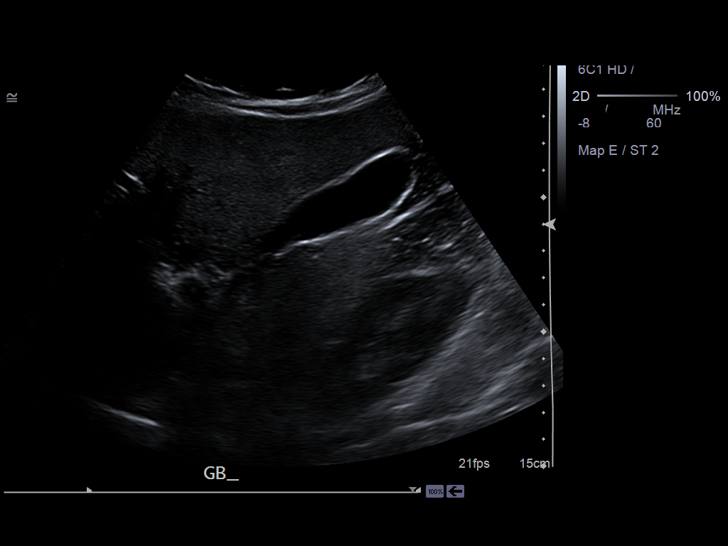
[im 47/80]
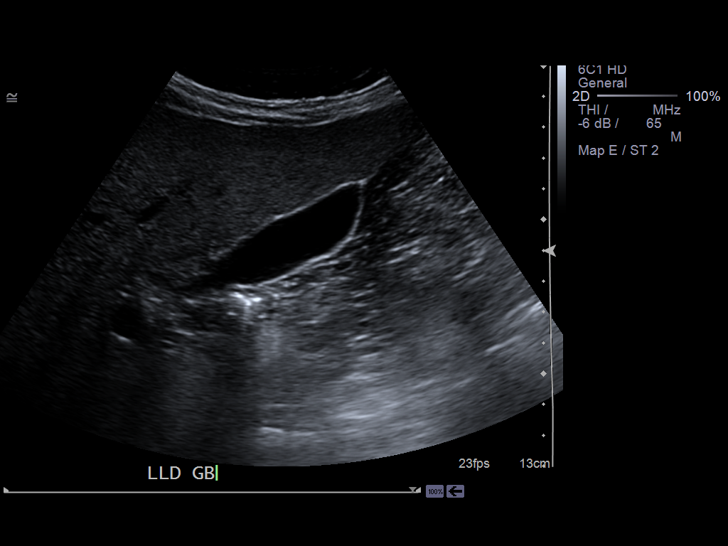
[im 53/80]
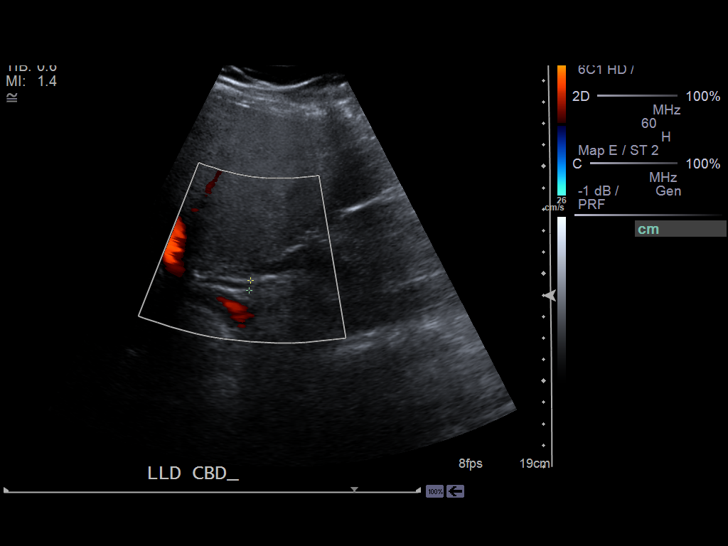
[im 60/80]
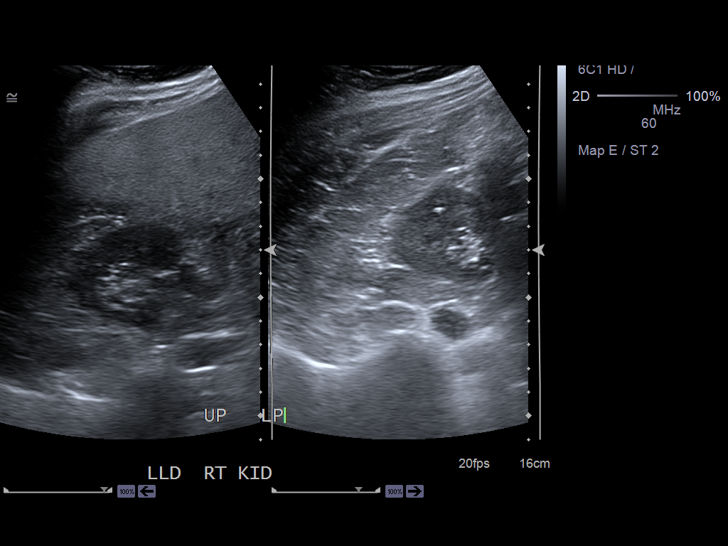
[im 66/80]
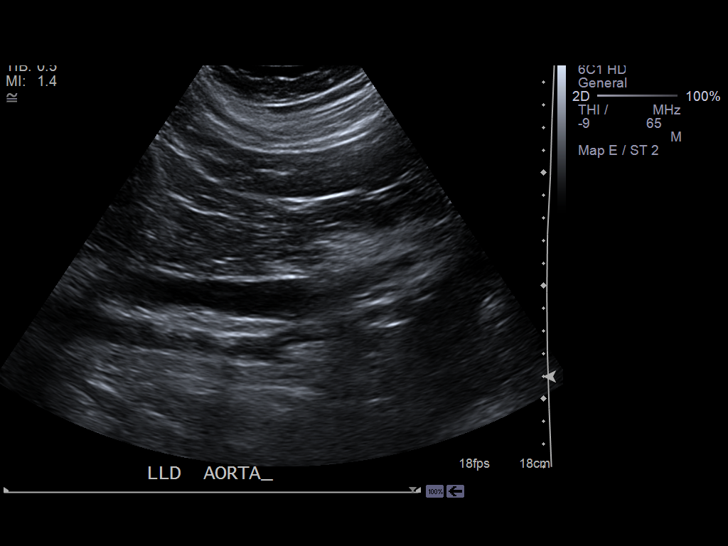
[im 73/80]
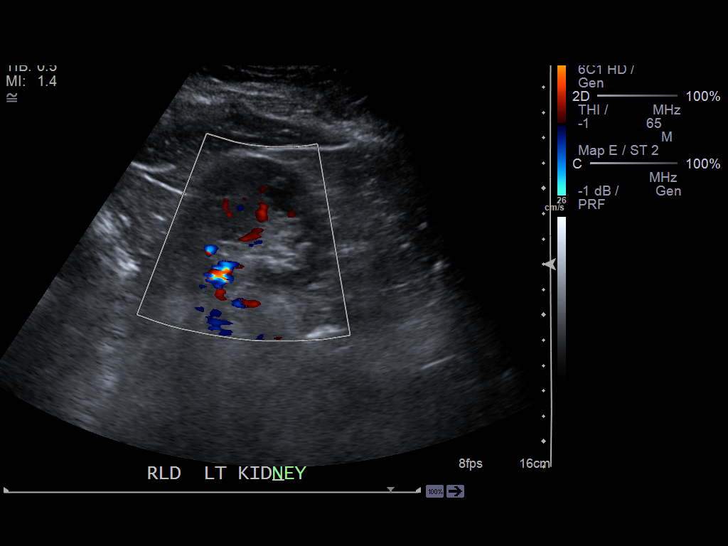
[im 80/80]
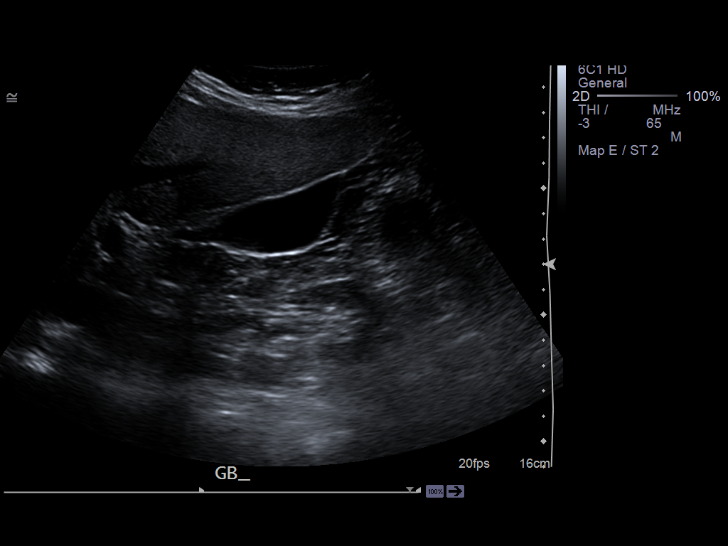

[13 of 25 positions shown; findings below may reference images not displayed]

FINDINGS: Visualized portions of the liver demonstrate normal echogenicity and normal
contours. There is a hypoechoic nodule in the left hepatic lobe measuring
1.3 x 1.1 x 1.1 cm with posterior to see enhancement.

There is no cholelithiasis or biliary sludge. There is no intra- or
extrahepatic biliary ductal dilatation. The common duct measures 4.8 mm in
maximal diameter. There is no gallbladder wall thickening, pericholecystic
fluid, or sonographic Murphy's sign.

The visualized portion of the pancreas is normal in echogenicity. The spleen
is unremarkable. Bilateral kidneys are normal in echogenicity and size. The
right kidney measures 10.9 x 5.2 x 4.5 cm. The left kidney measures 11.4 x
5.3 x 5.4 cm. There are no renal calculi or hydronephrosis. The abdominal
aorta and IVC are unremarkable.
IMPRESSION: 1. The no cholelithiasis or sonographic evidence of acute cholecystitis.
2. Small hypoechoic nodule in the left hepatic lobe which is indeterminate.
This may represent a complex cyst versus hypoechoic mass. Recommend further
evaluation with a multiphasic MRI of the abdomen for better characterization.

[REDACTED]

## 2013-05-16 NOTE — Assessment & Plan Note (Signed)
Pelvic exam was deferred per patient request She will be referred for DEXA scan as she has not been on HRT for over 10 years.

## 2013-05-16 NOTE — Progress Notes (Signed)
Patient ID: TAIWAN MILLON, female   DOB: 04-04-1961, 52 y.o.   MRN: [id number]  Subjective:     Carrie Swanson is a 52 y.o. female and is here for a comprehensive physical exam. The patient reports problems - with recurrent daily headaches attributed to migraine .  History   Social History  . Marital Status: Married    Spouse Name: N/A    Number of Children: N/A  . Years of Education: N/A   Occupational History  . Not on file.   Social History Main Topics  . Smoking status: Never Smoker   . Smokeless tobacco: Never Used  . Alcohol Use: No  . Drug Use: No  . Sexual Activity: Not on file   Other Topics Concern  . Not on file   Social History Narrative  . No narrative on file   Health Maintenance  Topic Date Due  . Influenza Vaccine  01/17/2013  . Mammogram  03/08/2014  . Pap Smear  05/09/2015  . Tetanus/tdap  07/21/2017  . Colonoscopy  02/05/2022    The following portions of the patient's history were reviewed and updated as appropriate: allergies, current medications, past family history, past medical history, past social history, past surgical history and problem list.  Review of Systems A comprehensive review of systems was negative.   Objective:   General Appearance:    Alert, cooperative, no distress, appears stated age  Head:    Normocephalic, without obvious abnormality, atraumatic  Eyes:    PERRL, conjunctiva/corneas clear, EOM's intact, fundi    benign, both eyes  Ears:    Normal TM's and external ear canals, both ears  Nose:   Nares normal, septum midline, mucosa normal, no drainage    or sinus tenderness  Throat:   Lips, mucosa, and tongue normal; teeth and gums normal  Neck:   Supple, symmetrical, trachea midline, no adenopathy;    thyroid:  no enlargement/tenderness/nodules; no carotid   bruit or JVD  Back:     Symmetric, no curvature, ROM normal, no CVA tenderness  Lungs:     Clear to auscultation bilaterally, respirations unlabored  Chest Wall:     No tenderness or deformity   Heart:    Regular rate and rhythm, S1 and S2 normal, no murmur, rub   or gallop  Breast Exam:    No tenderness, masses, or nipple abnormality  Abdomen:     Soft, non-tender, bowel sounds active all four quadrants,    no masses, no organomegaly  Genitalia:     deferred  Extremities:   Extremities normal, atraumatic, no cyanosis or edema  Pulses:   2+ and symmetric all extremities  Skin:   Skin color, texture, turgor normal, no rashes or lesions  Lymph nodes:   Cervical, supraclavicular, and axillary nodes normal  Neurologic:   CNII-XII intact, normal strength, sensation and reflexes    throughout     Assessment:   S/P total hysterectomy and bilateral salpingo-oophorectomy Pelvic exam was deferred per patient request She will be referred for DEXA scan as she has not been on HRT for over 10 years.    Routine general medical examination at a health care facility Annual comprehensive exam was done including breast, excluding pelvic and PAP smear. All screenings have been addressed .   Overweight (BMI 25.0-29.9) She has lost considerable wt since last visit . Encouragement given.   Migraines Started occurring on a daily basis a few months ago march,  Recent eye exam ruled out  vision problems.  trial of amitripytline discussed for prophylaxis ,starting with 25 mg qhs  and relpax samples (40 mg) as a tiral for treatment    Updated Medication List Outpatient Encounter Prescriptions as of 05/13/2013  Medication Sig  . Calcium Carb-Cholecalciferol (CALCIUM + D3) 600-200 MG-UNIT TABS Take by mouth.  . Nutritional Supplements (ESTROVEN PO) Take 1 tablet by mouth daily.  Marland Kitchen amitriptyline (ELAVIL) 25 MG tablet Take 1 tablet (25 mg total) by mouth at bedtime. May increase to 2 after tw weeks if needed  . eletriptan (RELPAX) 40 MG tablet Take 1 tablet (40 mg total) by mouth as needed for migraine or headache. One tablet by mouth at onset of headache. May repeat in 2  hours if headache persists or recurs.

## 2013-05-16 NOTE — Assessment & Plan Note (Signed)
Annual comprehensive exam was done including breast, excluding pelvic and PAP smear. All screenings have been addressed .  

## 2013-05-16 NOTE — Assessment & Plan Note (Addendum)
Started occurring on a daily basis a few months ago march,  Recent eye exam ruled out vision problems.  trial of amitripytline discussed for prophylaxis ,starting with 25 mg qhs  and relpax samples (40 mg) as a tiral for treatment

## 2013-05-16 NOTE — Assessment & Plan Note (Signed)
She has lost considerable wt since last visit . Encouragement given.

## 2013-06-02 ENCOUNTER — Other Ambulatory Visit (INDEPENDENT_AMBULATORY_CARE_PROVIDER_SITE_OTHER): Payer: BC Managed Care – PPO

## 2013-06-02 ENCOUNTER — Emergency Department: Payer: Self-pay | Admitting: Emergency Medicine

## 2013-06-02 DIAGNOSIS — E559 Vitamin D deficiency, unspecified: Secondary | ICD-10-CM

## 2013-06-02 DIAGNOSIS — R5381 Other malaise: Secondary | ICD-10-CM

## 2013-06-02 DIAGNOSIS — E785 Hyperlipidemia, unspecified: Secondary | ICD-10-CM

## 2013-06-02 LAB — COMPREHENSIVE METABOLIC PANEL
AST: 25 U/L (ref 0–37)
Albumin: 3.8 g/dL (ref 3.4–5.0)
Alkaline Phosphatase: 71 U/L (ref 39–117)
Anion Gap: 2 — ABNORMAL LOW (ref 7–16)
BUN: 15 mg/dL (ref 6–23)
BUN: 15 mg/dL (ref 7–18)
Bilirubin,Total: 0.3 mg/dL (ref 0.2–1.0)
CO2: 30 mEq/L (ref 19–32)
Calcium, Total: 8.8 mg/dL (ref 8.5–10.1)
Chloride: 103 mEq/L (ref 96–112)
Chloride: 105 mmol/L (ref 98–107)
Creatinine: 0.76 mg/dL (ref 0.60–1.30)
EGFR (Non-African Amer.): >60
GFR: 87.12 mL/min (ref >60.00)
Glucose, Bld: 85 mg/dL (ref 70–99)
Glucose: 101 mg/dL — ABNORMAL HIGH (ref 65–99)
Osmolality: 275 (ref 275–301)
Potassium: 4.1 mmol/L (ref 3.5–5.1)
SGOT(AST): 29 U/L (ref 15–37)
SGPT (ALT): 27 U/L (ref 12–78)
Sodium: 140 mEq/L (ref 135–145)
Total Bilirubin: 0.5 mg/dL (ref 0.3–1.2)
Total Protein: 7.4 g/dL (ref 6.0–8.3)

## 2013-06-02 LAB — TROPONIN I: Troponin-I: <0.02 ng/mL

## 2013-06-02 LAB — CBC WITH DIFFERENTIAL/PLATELET
Basophils Relative: 0.3 % (ref 0.0–3.0)
Eosinophils Relative: 3.8 % (ref 0.0–5.0)
HCT: 43.3 % (ref 36.0–46.0)
Hemoglobin: 14.6 g/dL (ref 12.0–15.0)
Lymphs Abs: 1.8 10*3/uL (ref 0.7–4.0)
MCV: 87.6 fl (ref 78.0–100.0)
Monocytes Absolute: 0.6 10*3/uL (ref 0.1–1.0)
Monocytes Relative: 8.9 % (ref 3.0–12.0)
Neutro Abs: 4.1 10*3/uL (ref 1.4–7.7)
RBC: 4.95 Mil/uL (ref 3.87–5.11)
WBC: 6.8 10*3/uL (ref 4.5–10.5)

## 2013-06-02 LAB — LIPID PANEL
Cholesterol: 159 mg/dL (ref 0–200)
HDL: 54.8 mg/dL (ref >39.00)
LDL Cholesterol: 96 mg/dL (ref 0–99)
Total CHOL/HDL Ratio: 3
VLDL: 8 mg/dL (ref 0.0–40.0)

## 2013-06-02 LAB — CK TOTAL AND CKMB (NOT AT ARMC): CK-MB: 1 ng/mL (ref 0.5–3.6)

## 2013-06-02 LAB — TSH: TSH: 2.16 u[IU]/mL (ref 0.35–5.50)

## 2013-06-02 LAB — CBC
HCT: 41.5 % (ref 35.0–47.0)
MCHC: 33.2 g/dL (ref 32.0–36.0)
MCV: 89 fL (ref 80–100)
Platelet: 276 10*3/uL (ref 150–440)
RBC: 4.69 10*6/uL (ref 3.80–5.20)

## 2013-06-03 LAB — VITAMIN D 25 HYDROXY (VIT D DEFICIENCY, FRACTURES): Vit D, 25-Hydroxy: 37 ng/mL (ref 30–89)

## 2013-06-04 ENCOUNTER — Encounter: Payer: Self-pay | Admitting: Internal Medicine

## 2013-06-06 NOTE — Telephone Encounter (Signed)
Mailed unread message to pt  

## 2014-02-20 ENCOUNTER — Ambulatory Visit (INDEPENDENT_AMBULATORY_CARE_PROVIDER_SITE_OTHER): Payer: BC Managed Care – PPO | Admitting: Family Medicine

## 2014-02-20 ENCOUNTER — Encounter: Payer: Self-pay | Admitting: Family Medicine

## 2014-02-20 VITALS — BP 168/98 | HR 70 | Temp 98.3°F | Wt 141.5 lb

## 2014-02-20 DIAGNOSIS — H6983 Other specified disorders of Eustachian tube, bilateral: Secondary | ICD-10-CM

## 2014-02-20 DIAGNOSIS — H6980 Other specified disorders of Eustachian tube, unspecified ear: Secondary | ICD-10-CM

## 2014-02-20 NOTE — Patient Instructions (Signed)
Eustachian tube dysfunction.  Tylenol or ibuprofen for pain.  Use nasal saline and/or flonase (2 sprays per nostril per day).  Gently try to pop your ears.  Take care.

## 2014-02-20 NOTE — Progress Notes (Signed)
Pre visit review using our clinic review tool, if applicable. No additional management support is needed unless otherwise documented below in the visit note.  Sx started about yesterday afternoon. ST and L>R ear pain.  Fatigued.  Works at a nursing home.  No FCNAVD.  Minimal cough.  No sputum.  No wheeze.  No rhinorrhea.  Some aches.  Still normal appetite.  She can't pop her ears.    Meds, vitals, and allergies reviewed.   ROS: See HPI.  Otherwise, noncontributory.  GEN: nad, alert and oriented HEENT: mucous membranes moist, tm w/o erythema, B (L>R) SOM noted, nasal exam w/o erythema, clear discharge noted,  OP with cobblestoning NECK: supple w/o LA CV: rrr.   PULM: ctab, no inc wob EXT: no edema SKIN: no acute rash

## 2014-02-20 NOTE — Assessment & Plan Note (Signed)
Likely viral, with postnasal sx causing ST.  D/w pt.  Flonase, nasal saline, gently valsalva.  Tylenol/ibuprofen, rest and fluids.  F/u prn.  Nontoxic.

## 2014-04-03 ENCOUNTER — Other Ambulatory Visit: Payer: Self-pay

## 2014-06-18 ENCOUNTER — Ambulatory Visit (INDEPENDENT_AMBULATORY_CARE_PROVIDER_SITE_OTHER): Payer: BC Managed Care – PPO | Admitting: Internal Medicine

## 2014-06-18 ENCOUNTER — Encounter: Payer: Self-pay | Admitting: Internal Medicine

## 2014-06-18 VITALS — BP 148/88 | HR 71 | Temp 98.1°F | Resp 16 | Ht 61.0 in | Wt 142.0 lb

## 2014-06-18 DIAGNOSIS — Z13820 Encounter for screening for osteoporosis: Secondary | ICD-10-CM

## 2014-06-18 DIAGNOSIS — Z90710 Acquired absence of both cervix and uterus: Secondary | ICD-10-CM

## 2014-06-18 DIAGNOSIS — R5383 Other fatigue: Secondary | ICD-10-CM

## 2014-06-18 DIAGNOSIS — E785 Hyperlipidemia, unspecified: Secondary | ICD-10-CM

## 2014-06-18 DIAGNOSIS — Z1239 Encounter for other screening for malignant neoplasm of breast: Secondary | ICD-10-CM

## 2014-06-18 DIAGNOSIS — Z90722 Acquired absence of ovaries, bilateral: Secondary | ICD-10-CM

## 2014-06-18 DIAGNOSIS — Z9079 Acquired absence of other genital organ(s): Secondary | ICD-10-CM

## 2014-06-18 DIAGNOSIS — Z0000 Encounter for general adult medical examination without abnormal findings: Secondary | ICD-10-CM

## 2014-06-18 DIAGNOSIS — Z1159 Encounter for screening for other viral diseases: Secondary | ICD-10-CM

## 2014-06-18 DIAGNOSIS — E663 Overweight: Secondary | ICD-10-CM

## 2014-06-18 DIAGNOSIS — E559 Vitamin D deficiency, unspecified: Secondary | ICD-10-CM

## 2014-06-18 LAB — VITAMIN D 25 HYDROXY (VIT D DEFICIENCY, FRACTURES): VITD: 31.18 ng/mL (ref 30.00–100.00)

## 2014-06-18 LAB — COMPREHENSIVE METABOLIC PANEL
ALT: 31 U/L (ref 0–35)
AST: 25 U/L (ref 0–37)
Albumin: 4.6 g/dL (ref 3.5–5.2)
Alkaline Phosphatase: 65 U/L (ref 39–117)
BILIRUBIN TOTAL: 0.6 mg/dL (ref 0.2–1.2)
BUN: 17 mg/dL (ref 6–23)
CHLORIDE: 105 meq/L (ref 96–112)
CO2: 27 mEq/L (ref 19–32)
Calcium: 9.4 mg/dL (ref 8.4–10.5)
Creatinine, Ser: 0.7 mg/dL (ref 0.4–1.2)
GFR: 89.57 mL/min (ref >60.00)
Glucose, Bld: 95 mg/dL (ref 70–99)
Potassium: 4.2 mEq/L (ref 3.5–5.1)
SODIUM: 139 meq/L (ref 135–145)
Total Protein: 7.1 g/dL (ref 6.0–8.3)

## 2014-06-18 LAB — CBC WITH DIFFERENTIAL/PLATELET
Basophils Absolute: 0 10*3/uL (ref 0.0–0.1)
Basophils Relative: 0.5 % (ref 0.0–3.0)
EOS ABS: 0.3 10*3/uL (ref 0.0–0.7)
Eosinophils Relative: 3.5 % (ref 0.0–5.0)
HCT: 42.8 % (ref 36.0–46.0)
Hemoglobin: 14.2 g/dL (ref 12.0–15.0)
LYMPHS ABS: 1.9 10*3/uL (ref 0.7–4.0)
Lymphocytes Relative: 25.6 % (ref 12.0–46.0)
MCHC: 33.1 g/dL (ref 30.0–36.0)
MCV: 87 fl (ref 78.0–100.0)
MONO ABS: 0.7 10*3/uL (ref 0.1–1.0)
Monocytes Relative: 9.4 % (ref 3.0–12.0)
Neutro Abs: 4.4 10*3/uL (ref 1.4–7.7)
Neutrophils Relative %: 61 % (ref 43.0–77.0)
PLATELETS: 313 10*3/uL (ref 150.0–400.0)
RBC: 4.91 Mil/uL (ref 3.87–5.11)
RDW: 13.6 % (ref 11.5–15.5)
WBC: 7.3 10*3/uL (ref 4.0–10.5)

## 2014-06-18 LAB — LIPID PANEL
CHOLESTEROL: 171 mg/dL (ref 0–200)
HDL: 58.4 mg/dL (ref >39.00)
LDL Cholesterol: 99 mg/dL (ref 0–99)
NonHDL: 112.6
TRIGLYCERIDES: 68 mg/dL (ref 0.0–149.0)
Total CHOL/HDL Ratio: 3
VLDL: 13.6 mg/dL (ref 0.0–40.0)

## 2014-06-18 LAB — TSH: TSH: 1.56 u[IU]/mL (ref 0.35–4.50)

## 2014-06-18 NOTE — Progress Notes (Signed)
Pre-visit discussion using our clinic review tool. No additional management support is needed unless otherwise documented below in the visit note.  

## 2014-06-18 NOTE — Patient Instructions (Signed)
This is  my version of a  "Low GI"  Diet:  It will  allow you to lose 4 to 8  lbs  per month if you follow it carefully AND add exercise to continue weight loss.  Your goal with exercise is a minimum of 30 minutes of aerobic exercise 5 days per week (Walking does not count once it becomes easy!)    All of the foods can be found at grocery stores and in bulk at [employer].  The Atkins protein bars and shakes are available in more varieties at [employer], [employer] and [place].     7 AM Breakfast:  Choose from the following:  Low carbohydrate Protein  Shakes (I recommend the EAS AdvantEdge "Carb Control" shakes  Or the low carb shakes by Atkins.    2.5 carbs   Arnold's "Sandwhich Thin"toasted  w/ peanut butter (no jelly: about 20 net carbs  "Bagel Thin" with cream cheese and salmon: about 20 carbs   a scrambled egg/bacon/cheese burrito made with Mission's "carb balance" whole wheat tortilla  (about 10 net carbs )   Avoid cereal and bananas, oatmeal and cream of wheat and grits. They are loaded with carbohydrates!   10 AM: high protein snack  Protein bar by Atkins (the snack size, under 200 cal, usually < 6 net carbs).    A stick of cheese:  Around 1 carb,  100 cal     Dannon Light n Fit [place] Yogurt  (80 cal, 8 carbs)  Other so called "protein bars" and Greek yogurts tend to be loaded with carbohydrates.  Remember, in food advertising, the word "energy" is synonymous for " carbohydrate."  Lunch:   A Sandwich using the bread choices listed, Can use any  Eggs,  lunchmeat, grilled meat or canned tuna), avocado, regular mayo/mustard  and cheese.  A Salad using blue cheese, ranch,  Goddess or vinagrette,  No croutons or "confetti" and no "candied nuts" but regular nuts OK.   No pretzels or chips.  Pickles and miniature sweet peppers are a good low carb alternative that provide a "crunch"  The bread is the only source of carbohydrate in a sandwich and  can be decreased by trying some of these  alternatives to traditional loaf bread  Joseph's makes a pita bread and a flat bread that are 50 cal and 4 net carbs available at [place] and [place].  This can be toasted to use with hummous as well  Toufayan makes a low carb flatbread that's 100 cal and 9 net carbs available at [employer] and [employer]'s makes 2 sizes of  Low carb whole wheat tortilla  (The large one is 210 cal and 6 net carbs) Avoid "Low fat dressings, as well as Barry Brunner and [place] dressings They are loaded with sugar!   3 PM/ Mid day  Snack:  Consider  1 ounce of  almonds, walnuts, pistachios, pecans, peanuts,  Macadamia nuts or a nut medley.  Avoid "granola"; the dried cranberries and raisins are loaded with carbohydrates. Mixed nuts as long as there are no raisins,  cranberries or dried fruit.    Try the prosciutto/mozzarella cheese sticks by Fiorruci  In deli /backery section   High protein      6 PM  Dinner:     Meat/fowl/fish with a green salad, and either broccoli, cauliflower, green beans, spinach, brussel sprouts or  Lima beans. DO NOT BREAD THE PROTEIN!!      There is a  low carb pasta by Dreamfield's that is acceptable and tastes great: only 5 digestible carbs/serving.( All grocery stores but BJs carry it )  Try Hurley Cisco Angelo's chicken piccata or chicken or eggplant parm over low carb pasta.(Lowes and BJs)   Marjory Lies Sanchez's "Carnitas" (pulled pork, no sauce,  0 carbs) or his beef pot roast to make a dinner burrito (at [employer]J's)  Pesto over low carb pasta (bj's sells a good quality pesto in the center refrigerated section of the deli   Try satueeing  Cheral Marker with mushroooms  Whole wheat pasta is still full of digestible carbs and  Not as low in glycemic index as Dreamfield's.   Brown rice is still rice,  So skip the rice and noodles if you eat [place] or [place] (or at least limit to 1/2 cup)  9 PM snack :   Breyer's "low carb" fudgsicle or  ice cream bar (Carb Smart line), or  Weight Watcher's ice cream bar ,  or another "no sugar added" ice cream;  a serving of fresh berries/cherries with whipped cream   Cheese or DANNON'S LlGHT N FIT [place] YOGURT  8 ounces of [place] unsweetened almond/cococunut milk    Avoid bananas, pineapple, grapes  and watermelon on a regular basis because they are high in sugar.  THINK OF THEM AS DESSERT  Remember that snack Substitutions should be less than 10 NET carbs per serving and meals < 20 carbs. Remember to subtract fiber grams to get the "net carbs."  Health Maintenance Adopting a healthy lifestyle and getting preventive care can go a long way to promote health and wellness. Talk with your health care provider about what schedule of regular examinations is right for you. This is a good chance for you to check in with your provider about disease prevention and staying healthy. In between checkups, there are plenty of things you can do on your own. Experts have done a lot of research about which lifestyle changes and preventive measures are most likely to keep you healthy. Ask your health care provider for more information. WEIGHT AND DIET  Eat a healthy diet  Be sure to include plenty of vegetables, fruits, low-fat dairy products, and lean protein.  Do not eat a lot of foods high in solid fats, added sugars, or salt.  Get regular exercise. This is one of the most important things you can do for your health.  Most adults should exercise for at least 150 minutes each week. The exercise should increase your heart rate and make you sweat (moderate-intensity exercise).  Most adults should also do strengthening exercises at least twice a week. This is in addition to the moderate-intensity exercise.  Maintain a healthy weight  Body mass index (BMI) is a measurement that can be used to identify possible weight problems. It estimates body fat based on height and weight. Your health care provider can help determine your BMI and help you achieve or maintain a healthy  weight.  For females 33 years of age and older:   A BMI below 18.5 is considered underweight.  A BMI of 18.5 to 24.9 is normal.  A BMI of 25 to 29.9 is considered overweight.  A BMI of 30 and above is considered obese.  Watch levels of cholesterol and blood lipids  You should start having your blood tested for lipids and cholesterol at 54 years of age, then have this test every 5 years.  You may need to have your cholesterol levels checked more  often if:  Your lipid or cholesterol levels are high.  You are older than 54 years of age.  You are at high risk for heart disease.  CANCER SCREENING   Lung Cancer  Lung cancer screening is recommended for adults 85-68 years old who are at high risk for lung cancer because of a history of smoking.  A yearly low-dose CT scan of the lungs is recommended for people who:  Currently smoke.  Have quit within the past 15 years.  Have at least a 30-pack-year history of smoking. A pack year is smoking an average of one pack of cigarettes a day for 1 year.  Yearly screening should continue until it has been 15 years since you quit.  Yearly screening should stop if you develop a health problem that would prevent you from having lung cancer treatment.  Breast Cancer  Practice breast self-awareness. This means understanding how your breasts normally appear and feel.  It also means doing regular breast self-exams. Let your health care provider know about any changes, no matter how small.  If you are in your 20s or 30s, you should have a clinical breast exam (CBE) by a health care provider every 1-3 years as part of a regular health exam.  If you are 78 or older, have a CBE every year. Also consider having a breast X-ray (mammogram) every year.  If you have a family history of breast cancer, talk to your health care provider about genetic screening.  If you are at high risk for breast cancer, talk to your health care provider about  having an MRI and a mammogram every year.  Breast cancer gene (BRCA) assessment is recommended for women who have family members with BRCA-related cancers. BRCA-related cancers include:  Breast.  Ovarian.  Tubal.  Peritoneal cancers.  Results of the assessment will determine the need for genetic counseling and BRCA1 and BRCA2 testing. Cervical Cancer Routine pelvic examinations to screen for cervical cancer are no longer recommended for nonpregnant women who are considered low risk for cancer of the pelvic organs (ovaries, uterus, and vagina) and who do not have symptoms. A pelvic examination may be necessary if you have symptoms including those associated with pelvic infections. Ask your health care provider if a screening pelvic exam is right for you.   The Pap test is the screening test for cervical cancer for women who are considered at risk.  If you had a hysterectomy for a problem that was not cancer or a condition that could lead to cancer, then you no longer need Pap tests.  If you are older than 65 years, and you have had normal Pap tests for the past 10 years, you no longer need to have Pap tests.  If you have had past treatment for cervical cancer or a condition that could lead to cancer, you need Pap tests and screening for cancer for at least 20 years after your treatment.  If you no longer get a Pap test, assess your risk factors if they change (such as having a new sexual partner). This can affect whether you should start being screened again.  Some women have medical problems that increase their chance of getting cervical cancer. If this is the case for you, your health care provider may recommend more frequent screening and Pap tests.  The human papillomavirus (HPV) test is another test that may be used for cervical cancer screening. The HPV test looks for the virus that can cause cell changes in  the cervix. The cells collected during the Pap test can be tested for  HPV.  The HPV test can be used to screen women 69 years of age and older. Getting tested for HPV can extend the interval between normal Pap tests from three to five years.  An HPV test also should be used to screen women of any age who have unclear Pap test results.  After 54 years of age, women should have HPV testing as often as Pap tests.  Colorectal Cancer  This type of cancer can be detected and often prevented.  Routine colorectal cancer screening usually begins at 54 years of age and continues through 54 years of age.  Your health care provider may recommend screening at an earlier age if you have risk factors for colon cancer.  Your health care provider may also recommend using home test kits to check for hidden blood in the stool.  A small camera at the end of a tube can be used to examine your colon directly (sigmoidoscopy or colonoscopy). This is done to check for the earliest forms of colorectal cancer.  Routine screening usually begins at age 13.  Direct examination of the colon should be repeated every 5-10 years through 54 years of age. However, you may need to be screened more often if early forms of precancerous polyps or small growths are found. Skin Cancer  Check your skin from head to toe regularly.  Tell your health care provider about any new moles or changes in moles, especially if there is a change in a mole's shape or color.  Also tell your health care provider if you have a mole that is larger than the size of a pencil eraser.  Always use sunscreen. Apply sunscreen liberally and repeatedly throughout the day.  Protect yourself by wearing long sleeves, pants, a wide-brimmed hat, and sunglasses whenever you are outside. HEART DISEASE, DIABETES, AND HIGH BLOOD PRESSURE   Have your blood pressure checked at least every 1-2 years. High blood pressure causes heart disease and increases the risk of stroke.  If you are between 62 years and 28 years old, ask  your health care provider if you should take aspirin to prevent strokes.  Have regular diabetes screenings. This involves taking a blood sample to check your fasting blood sugar level.  If you are at a normal weight and have a low risk for diabetes, have this test once every three years after 54 years of age.  If you are overweight and have a high risk for diabetes, consider being tested at a younger age or more often. PREVENTING INFECTION  Hepatitis B  If you have a higher risk for hepatitis B, you should be screened for this virus. You are considered at high risk for hepatitis B if:  You were born in a country where hepatitis B is common. Ask your health care provider which countries are considered high risk.  Your parents were born in a high-risk country, and you have not been immunized against hepatitis B (hepatitis B vaccine).  You have HIV or AIDS.  You use needles to inject street drugs.  You live with someone who has hepatitis B.  You have had sex with someone who has hepatitis B.  You get hemodialysis treatment.  You take certain medicines for conditions, including cancer, organ transplantation, and autoimmune conditions. Hepatitis C  Blood testing is recommended for:  Everyone born from [birth year] through 1966.  Anyone with known risk factors for hepatitis C. Sexually  transmitted infections (STIs)  You should be screened for sexually transmitted infections (STIs) including gonorrhea and chlamydia if:  You are sexually active and are younger than 54 years of age.  You are older than 54 years of age and your health care provider tells you that you are at risk for this type of infection.  Your sexual activity has changed since you were last screened and you are at an increased risk for chlamydia or gonorrhea. Ask your health care provider if you are at risk.  If you do not have HIV, but are at risk, it may be recommended that you take a prescription medicine daily to  prevent HIV infection. This is called pre-exposure prophylaxis (PrEP). You are considered at risk if:  You are sexually active and do not regularly use condoms or know the HIV status of your partner(s).  You take drugs by injection.  You are sexually active with a partner who has HIV. Talk with your health care provider about whether you are at high risk of being infected with HIV. If you choose to begin PrEP, you should first be tested for HIV. You should then be tested every 3 months for as long as you are taking PrEP.  PREGNANCY   If you are premenopausal and you may become pregnant, ask your health care provider about preconception counseling.  If you may become pregnant, take 400 to 800 micrograms (mcg) of folic acid every day.  If you want to prevent pregnancy, talk to your health care provider about birth control (contraception). OSTEOPOROSIS AND MENOPAUSE   Osteoporosis is a disease in which the bones lose minerals and strength with aging. This can result in serious bone fractures. Your risk for osteoporosis can be identified using a bone density scan.  If you are 77 years of age or older, or if you are at risk for osteoporosis and fractures, ask your health care provider if you should be screened.  Ask your health care provider whether you should take a calcium or vitamin D supplement to lower your risk for osteoporosis.  Menopause may have certain physical symptoms and risks.  Hormone replacement therapy may reduce some of these symptoms and risks. Talk to your health care provider about whether hormone replacement therapy is right for you.  HOME CARE INSTRUCTIONS   Schedule regular health, dental, and eye exams.  Stay current with your immunizations.   Do not use any tobacco products including cigarettes, chewing tobacco, or electronic cigarettes.  If you are pregnant, do not drink alcohol.  If you are breastfeeding, limit how much and how often you drink  alcohol.  Limit alcohol intake to no more than 1 drink per day for nonpregnant women. One drink equals 12 ounces of beer, 5 ounces of wine, or 1 ounces of hard liquor.  Do not use street drugs.  Do not share needles.  Ask your health care provider for help if you need support or information about quitting drugs.  Tell your health care provider if you often feel depressed.  Tell your health care provider if you have ever been abused or do not feel safe at home. Document Released: 12/19/2010 Document Revised: 10/20/2013 Document Reviewed: 05/07/2013 [facility] Patient Information 2015 [place], [place]. This information is not intended to replace advice given to you by your health care provider. Make sure you discuss any questions you have with your health care provider.

## 2014-06-18 NOTE — Progress Notes (Signed)
Patient ID: Carrie Swanson, female   DOB: 1960-08-15, 53 y.o.   MRN: [id number]  Subjective:     Carrie Swanson is a 53 y.o. female and is here for a comprehensive physical exam. The patient reports weight gain and vaginal dryness.    History   Social History  . Marital Status: Married    Spouse Name: N/A    Number of Children: N/A  . Years of Education: N/A   Occupational History  . Not on file.   Social History Main Topics  . Smoking status: Never Smoker   . Smokeless tobacco: Never Used  . Alcohol Use: No  . Drug Use: No  . Sexual Activity: Yes   Other Topics Concern  . Not on file   Social History Narrative   Health Maintenance  Topic Date Due  . PAP SMEAR  05/08/2013  . MAMMOGRAM  03/08/2014  . INFLUENZA VACCINE  07/20/2015 (Originally 01/17/2014)  . TETANUS/TDAP  06/18/2018  . COLONOSCOPY  02/05/2022    The following portions of the patient's history were reviewed and updated as appropriate: allergies, current medications, past family history, past medical history, past social history, past surgical history and problem list.  Review of Systems  Patient denies headache, fevers, malaise, unintentional weight loss, skin rash, eye pain, sinus congestion and sinus pain, sore throat, dysphagia,  hemoptysis , cough, dyspnea, wheezing, chest pain, palpitations, orthopnea, edema, abdominal pain, nausea, melena, diarrhea, constipation, flank pain, dysuria, hematuria, urinary  Frequency, nocturia, numbness, tingling, seizures,  Focal weakness, Loss of consciousness,  Tremor, insomnia, depression, anxiety, and suicidal ideation.      Objective:   General Appearance:    Alert, cooperative, no distress, appears stated age  Head:    Normocephalic, without obvious abnormality, atraumatic  Eyes:    PERRL, conjunctiva/corneas clear, EOM's intact, fundi    benign, both eyes  Ears:    Normal TM's and external ear canals, both ears  Nose:   Nares normal, septum midline, mucosa  normal, no drainage    or sinus tenderness  Throat:   Lips, mucosa, and tongue normal; teeth and gums normal  Neck:   Supple, symmetrical, trachea midline, no adenopathy;    thyroid:  no enlargement/tenderness/nodules; no carotid   bruit or JVD  Back:     Symmetric, no curvature, ROM normal, no CVA tenderness  Lungs:     Clear to auscultation bilaterally, respirations unlabored  Chest Wall:    No tenderness or deformity   Heart:    Regular rate and rhythm, S1 and S2 normal, no murmur, rub   or gallop  Breast Exam:    No tenderness, masses, or nipple abnormality  Abdomen:     Soft, non-tender, bowel sounds active all four quadrants,    no masses, no organomegaly  Genitalia:    Pelvic: cervix surgically absent, external genitalia normal, no adnexal masses or tenderness,rectovaginal septum normal, uterus surgically absent  and vagina normal without discharge  Extremities:   Extremities normal, atraumatic, no cyanosis or edema  Pulses:   2+ and symmetric all extremities  Skin:   Skin color, texture, turgor normal, no rashes or lesions  Lymph nodes:   Cervical, supraclavicular, and axillary nodes normal  Neurologic:   CNII-XII intact, normal strength, sensation and reflexes    throughout       Assessment and Plan:    Problem List Items Addressed This Visit    Encounter for preventive health examination    Annual wellness  exam  was done as well as a comprehensive physical exam and management of acute and chronic conditions .  During the course of the visit the patient was educated and counseled about appropriate screening and preventive services including :  diabetes screening, lipid analysis with projected  10 year  risk for CAD , nutrition counseling, colorectal cancer screening, and recommended immunizations.  Printed recommendations for health maintenance screenings was given.     Overweight (BMI 25.0-29.9)    I have addressed  BMI and recommended a low glycemic index diet utilizing  smaller more frequent meals to increase metabolism. Printed handout given,  I have also recommended that patient start exercising with a goal of 30 minutes of aerobic exercise a minimum of 5 days per week. Screening for lipid disorders, thyroid and diabetes to be done today.      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  Vaginal dryness secondary to menopause discussed, including  the various hormonal and  Non hormonal ways to treat symptoms, and the risks and benefits associated with all.       Other Visit Diagnoses    Breast cancer screening        Relevant Orders       MM DIGITAL SCREENING BILATERAL    Other fatigue        Relevant Orders       CBC with Differential (Completed)       Comprehensive metabolic panel (Completed)       TSH (Completed)    Vitamin D deficiency        Relevant Orders       Vit D  25 hydroxy (rtn osteoporosis monitoring) (Completed)    Hyperlipidemia        Relevant Orders       Lipid panel (Completed)    Need for hepatitis C screening test        Relevant Orders       Hepatitis C antibody    Screening for osteoporosis        Relevant Orders       DG Bone Density

## 2014-06-20 ENCOUNTER — Encounter: Payer: Self-pay | Admitting: Internal Medicine

## 2014-06-20 NOTE — Assessment & Plan Note (Signed)

## 2014-06-20 NOTE — Assessment & Plan Note (Signed)
Pelvic exam was done.  Vaginal dryness secondary to menopause discussed, including  the various hormonal and  Non hormonal ways to treat symptoms, and the risks and benefits associated with all.

## 2014-06-20 NOTE — Assessment & Plan Note (Signed)
I have addressed  BMI and recommended a low glycemic index diet utilizing smaller more frequent meals to increase metabolism. Printed handout given,  I have also recommended that patient start exercising with a goal of 30 minutes of aerobic exercise a minimum of 5 days per week. Screening for lipid disorders, thyroid and diabetes to be done today.

## 2014-06-21 ENCOUNTER — Encounter: Payer: Self-pay | Admitting: Internal Medicine

## 2014-06-24 LAB — HEPATITIS C ANTIBODY: HCV Ab: NEGATIVE

## 2014-07-16 ENCOUNTER — Ambulatory Visit (INDEPENDENT_AMBULATORY_CARE_PROVIDER_SITE_OTHER): Payer: BLUE CROSS/BLUE SHIELD | Admitting: Internal Medicine

## 2014-07-16 ENCOUNTER — Encounter: Payer: Self-pay | Admitting: Internal Medicine

## 2014-07-16 VITALS — BP 120/84 | HR 92 | Temp 98.2°F | Wt 143.8 lb

## 2014-07-16 DIAGNOSIS — J309 Allergic rhinitis, unspecified: Secondary | ICD-10-CM

## 2014-07-16 MED ORDER — PREDNISONE 10 MG PO TABS: ORAL_TABLET | ORAL | Status: AC

## 2014-07-16 NOTE — Patient Instructions (Signed)

## 2014-07-16 NOTE — Progress Notes (Signed)
Pre visit review using our clinic review tool, if applicable. No additional management support is needed unless otherwise documented below in the visit note. 

## 2014-07-16 NOTE — Progress Notes (Signed)
HPI  Pt presents to the clinic today with c/o ear pain, nasal congestion and cough. She reports this started 2 weeks ago. She is blowing clear mucous out of her nose. The cough is productive of yellow/green mucous. She thinks she has been running fevers intermittently. She denies chills and body aches. Her chest hurts when she coughs but she denies chest pain or shortness of breath. She did go to [facility] for the same 1 week ago. They gave her Augmentin. She has also been taking Zyrtec D but reports she is not feeling any worse. She does have a history of seasonal allergies. She has not had sick contacts that she is aware of. There is no documentation of flu vaccine in the system that I could see.  Review of Systems     History reviewed. No pertinent past medical history.  Family History  Problem Relation Age of Onset  . Hypertension Mother   . Heart disease Mother     old MI found o n screening  . Mental illness Father     alzheimer  . Heart disease Father   . Diabetes Sister   . Cancer Paternal Grandmother     leukemia  . Cancer Brother [id number]    prostate ca    History   Social History  . Marital Status: Married    Spouse Name: N/A    Number of Children: N/A  . Years of Education: N/A   Occupational History  . Not on file.   Social History Main Topics  . Smoking status: Never Smoker   . Smokeless tobacco: Never Used  . Alcohol Use: No  . Drug Use: No  . Sexual Activity: Yes   Other Topics Concern  . Not on file   Social History Narrative    Allergies  Allergen Reactions  . Cephalexin     REACTION: Hives  . Lisinopril     Causes cough  . Sulfonamide Derivatives     REACTION: Hives     Constitutional: Positive headache, fatigue and fever. Denies abrupt weight changes.  HEENT:  Positive nasal congestion, ear pain, sore throat. Denies eye redness, eye pain, pressure behind the eyes, facial pain, nasal congestion, ear pain, ringing in the ears, wax buildup, runny nose  or bloody nose. Respiratory: Positive cough. Denies difficulty breathing or shortness of breath.  Cardiovascular: Denies chest pain, chest tightness, palpitations or swelling in the hands or feet.   No other specific complaints in a complete review of systems (except as listed in HPI above).  Objective:   BP 120/84 mmHg  Pulse 92  Temp(Src) 98.2 F (36.8 C) (Oral)  Wt 143 lb 12 oz (65.205 kg)  SpO2 98% Wt Readings from Last 3 Encounters:  07/16/14 143 lb 12 oz (65.205 kg)  06/18/14 142 lb (64.411 kg)  02/20/14 141 lb 8 oz (64.184 kg)     General: Appears her stated age, well developed, well nourished in NAD. HEENT: Head: normal shape and size, no sinus tenderness noted; Eyes: sclera white, no icterus, conjunctiva pink; Ears: Tm's pink but intact, normal light reflex, + serous effusion bilaterally; Nose: mucosa boggy and moist, septum midline; Throat/Mouth: + PND. Teeth present, mucosa pink and moist, no exudate noted, no lesions or ulcerations noted.  Neck: No lymphadenopathy.  Cardiovascular: Normal rate and rhythm. S1,S2 noted.  No murmur, rubs or gallops noted.  Pulmonary/Chest: Normal effort and positive vesicular breath sounds. No respiratory distress. No wheezes, rales or ronchi noted.  Assessment & Plan:   Allergic Rhinitis:  She did not have improvement with abx, I do not think this in an infectious process Get some rest and drink plenty of water Do salt water gargles/Ibuprofen for the sore throat Continue Zyrtec D eRx for pred taper today  If no improvement by Monday, call me back, will try a zpack  RTC as needed or if symptoms persist.

## 2014-08-11 ENCOUNTER — Telehealth: Payer: Self-pay | Admitting: Internal Medicine

## 2014-08-11 ENCOUNTER — Ambulatory Visit: Payer: Self-pay | Admitting: Internal Medicine

## 2014-08-11 LAB — HM MAMMOGRAPHY: HM MAMMO: NEGATIVE

## 2014-08-11 NOTE — Telephone Encounter (Signed)
ERROR

## 2014-08-14 ENCOUNTER — Encounter: Payer: Self-pay | Admitting: Internal Medicine

## 2015-02-25 ENCOUNTER — Ambulatory Visit (INDEPENDENT_AMBULATORY_CARE_PROVIDER_SITE_OTHER)
Admission: RE | Admit: 2015-02-25 | Discharge: 2015-02-25 | Disposition: A | Discharge status: Home / Self Care | Payer: BLUE CROSS/BLUE SHIELD | Source: Ambulatory Visit | Attending: Family Medicine | Admitting: Family Medicine

## 2015-02-25 ENCOUNTER — Telehealth: Payer: Self-pay | Admitting: Family Medicine

## 2015-02-25 ENCOUNTER — Ambulatory Visit (INDEPENDENT_AMBULATORY_CARE_PROVIDER_SITE_OTHER): Payer: BLUE CROSS/BLUE SHIELD | Admitting: Family Medicine

## 2015-02-25 ENCOUNTER — Encounter: Payer: Self-pay | Admitting: Family Medicine

## 2015-02-25 VITALS — BP 136/88 | HR 78 | Temp 98.9°F | Ht 61.0 in | Wt 145.8 lb

## 2015-02-25 DIAGNOSIS — R059 Cough, unspecified: Secondary | ICD-10-CM

## 2015-02-25 DIAGNOSIS — R05 Cough: Secondary | ICD-10-CM

## 2015-02-25 DIAGNOSIS — R062 Wheezing: Secondary | ICD-10-CM | POA: Diagnosis not present

## 2015-02-25 MED ORDER — ALBUTEROL SULFATE HFA 108 (90 BASE) MCG/ACT IN AERS: 2.0000 | INHALATION_SPRAY | Freq: Four times a day (QID) | RESPIRATORY_TRACT | Status: AC | PRN

## 2015-02-25 MED ORDER — ALBUTEROL SULFATE (2.5 MG/3ML) 0.083% IN NEBU
2.5000 mg | INHALATION_SOLUTION | Freq: Once | RESPIRATORY_TRACT | Status: AC
Start: 2015-02-25 — End: 2015-02-25
  Administered 2015-02-25: 2.5 mg via RESPIRATORY_TRACT

## 2015-02-25 NOTE — Telephone Encounter (Signed)
Called and informed patient of normal CXR results. Symptoms likely are related to viral bronchitis and URI. Patient will pick up albuterol inhaler for as needed use. Given return precautions.

## 2015-02-25 NOTE — Progress Notes (Signed)
Patient ID: ASA FATH, female   DOB: [birth year], 54 y.o.   MRN: [id number]  Marikay Alar, MD Phone: [phone]  Carrie Swanson is a 54 y.o. female who presents today for same day appointment.  Cough: patient notes cough for the past 7 days. She feels a rattle when breathing and has noted a mild wheeze with this. Notes nasal congestion the past 3 monrings. Has felt feverish, though last temp check was last week and was 98.8 F. Notes mild bilateral lateral rib soreness with coughing over the past couple of days, though none at this time. No chest pain or shortness of breath. No asthma or smoking history. Note had this before. Feels overall tired.   PMH: nonsmoker.   ROS see HPI  Objective  Physical Exam Filed Vitals:   02/25/15 1442  BP: 136/88  Pulse: 78  Temp: 98.9 F (37.2 C)    Physical Exam  Constitutional: She is well-developed, well-nourished, and in no distress.  HENT:  Head: Normocephalic and atraumatic.  Right Ear: External ear normal.  Left Ear: External ear normal.  Mouth/Throat: Oropharynx is clear and moist. No oropharyngeal exudate.  Bilateral TMs normal  Eyes: Conjunctivae are normal. Pupils are equal, round, and reactive to light.  Neck: Neck supple.  Cardiovascular: Normal rate, regular rhythm and normal heart sounds.  Exam reveals no gallop and no friction rub.   No murmur heard. Pulmonary/Chest: Effort normal. No respiratory distress. She has wheezes (scattered mild wheezes).  Scattered crackles throughout lung fields R>L, no rib tenderness  Musculoskeletal: She exhibits no edema.  Lymphadenopathy:    She has no cervical adenopathy.  Neurological: She is alert.  Skin: Skin is warm and dry. She is not diaphoretic.  Non-toxic appearing   Assessment/Plan: Please see individual problem list.  Cough Patient with one week of cough. Has upper and lower respiratory symptoms. Is afebrile in clinic today. Normal O2 sat today. No respiratory distress.  Non-toxic appearing. With scattered crackles, wheezing, and URI symptoms likely this represents a viral infection. Will obtain a CXR to evaluate further. Discussed starting antibiotics prior to CXR, though patient opted for CXR results first. Given albuterol neb treatment in the office with wheezing and crackles stable, O2 sat to 98% s/p breathing treatment. Will give albuterol inhaler for PRN use. Will determine need for antibiotics based on CXR results and call patient with these. Verbally discussed return precautions with patient.     Orders Placed This Encounter  Procedures  . DG Chest 2 View    Standing Status: Future     Number of Occurrences:      Standing Expiration Date: 04/26/2016    Order Specific Question:  Reason for Exam (SYMPTOM  OR DIAGNOSIS REQUIRED)    Answer:  cough, bilateral crackles    Order Specific Question:  Is the patient pregnant?    Answer:  No    Order Specific Question:  Preferred imaging location?    Answer:  [facility]    Meds ordered this encounter  Medications  . albuterol (PROVENTIL) (2.5 MG/3ML) 0.083% nebulizer solution 2.5 mg    Sig:   . albuterol (PROVENTIL HFA;VENTOLIN HFA) 108 (90 BASE) MCG/ACT inhaler    Sig: Inhale 2 puffs into the lungs every 6 (six) hours as needed for wheezing or shortness of breath.    Dispense:  1 Inhaler    Refill:  0   Marikay Alar

## 2015-02-25 NOTE — Assessment & Plan Note (Addendum)
Patient with one week of cough. Has upper and lower respiratory symptoms. Is afebrile in clinic today. Normal O2 sat today. No respiratory distress. Non-toxic appearing. With scattered crackles, wheezing, and URI symptoms likely this represents a viral infection. Will obtain a CXR to evaluate further. Discussed starting antibiotics prior to CXR, though patient opted for CXR results first. Given albuterol neb treatment in the office with wheezing and crackles stable, O2 sat to 98% s/p breathing treatment. Will give albuterol inhaler for PRN use. Will determine need for antibiotics based on CXR results and call patient with these. Verbally discussed return precautions with patient.

## 2015-02-25 NOTE — Progress Notes (Signed)
Pre visit review using our clinic review tool, if applicable. No additional management support is needed unless otherwise documented below in the visit note. 

## 2015-02-25 NOTE — Patient Instructions (Signed)
Nice to meet you. You likely have a viral infection leading to an upper respiratory infection and bronchitis or viral pneumonia. I would like for you to go get a chest X-ray at our [facility] office to evaluate this issue.  I will prescribe an albuterol inhaler for this and use this if you wheeze or have shortness of breath.  We will call with the results of your chest x-ray.

## 2016-06-01 IMAGING — CR DG CHEST 2V
2 series · 2 of 2 positions shown · non-contrast
Comparison: Chest x-ray dated June 02, 2013

CLINICAL DATA: Cough, abnormal chest exam, nonsmoker.

EXAM:
CHEST  2 VIEW

[view not recorded (1 of 2)]
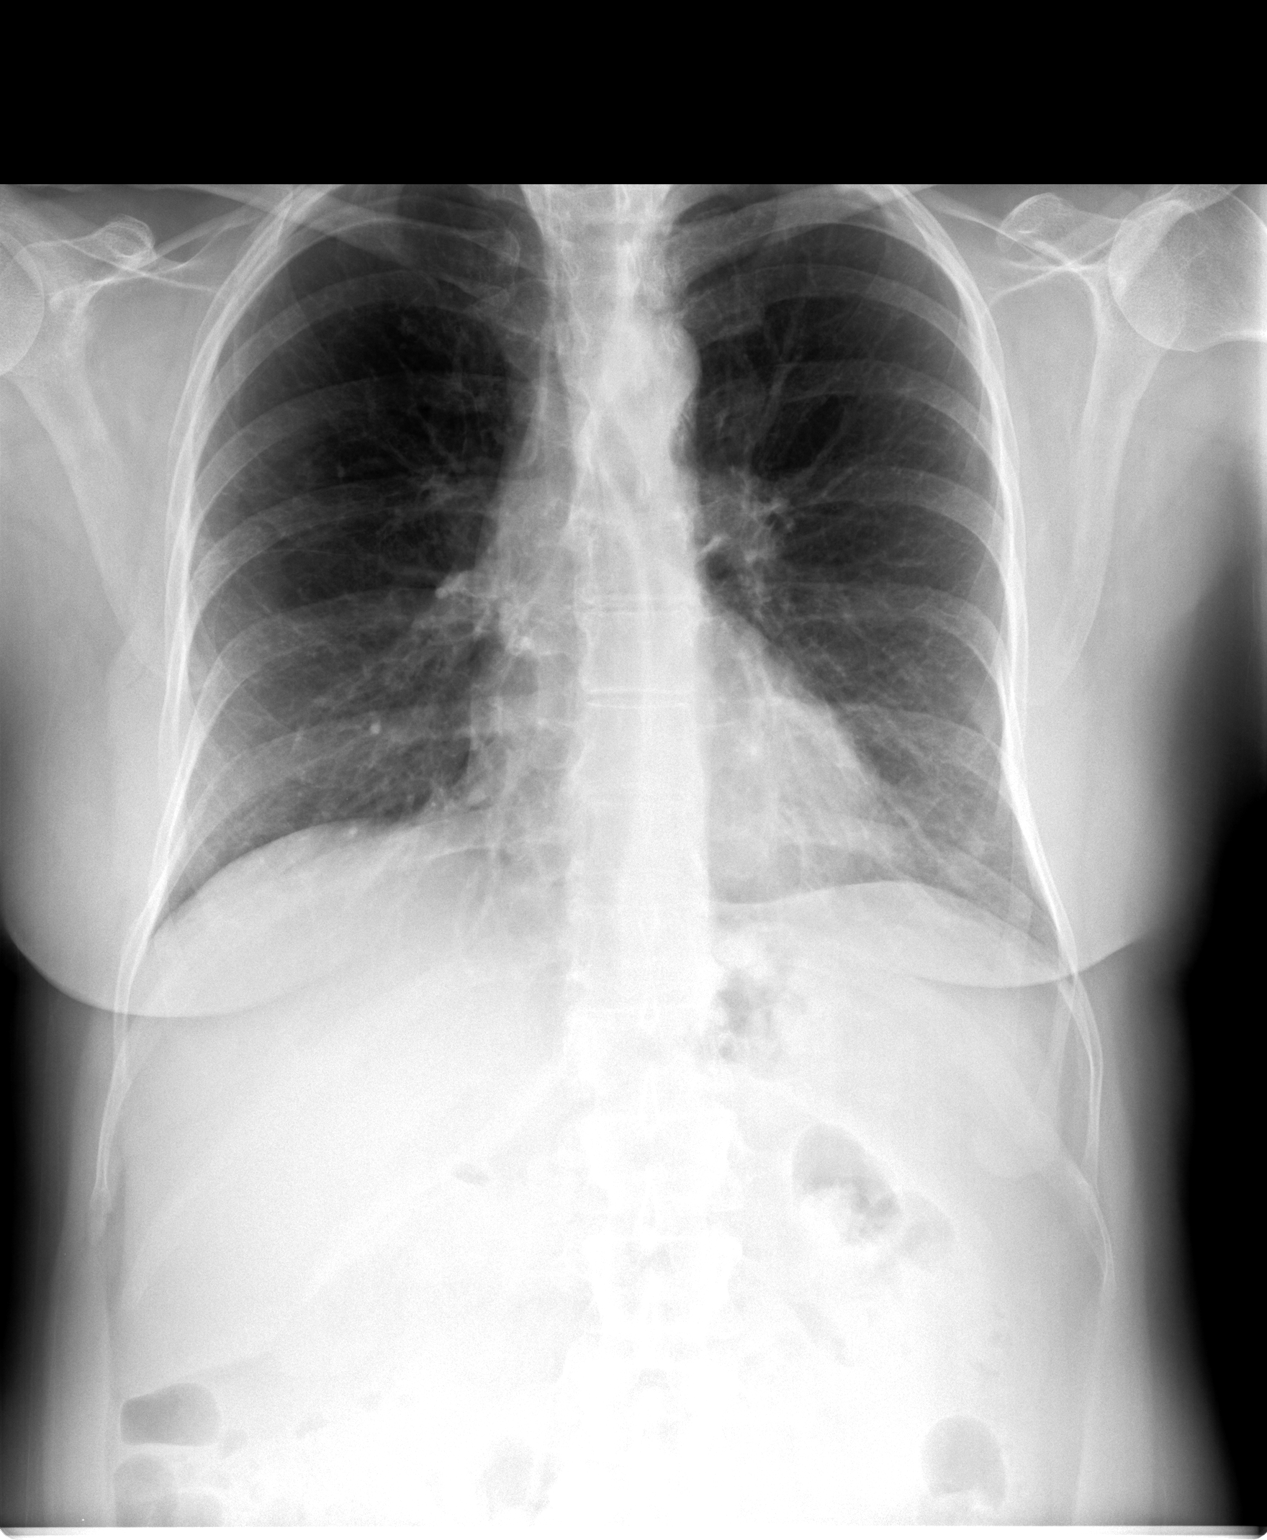

[view not recorded (2 of 2)]
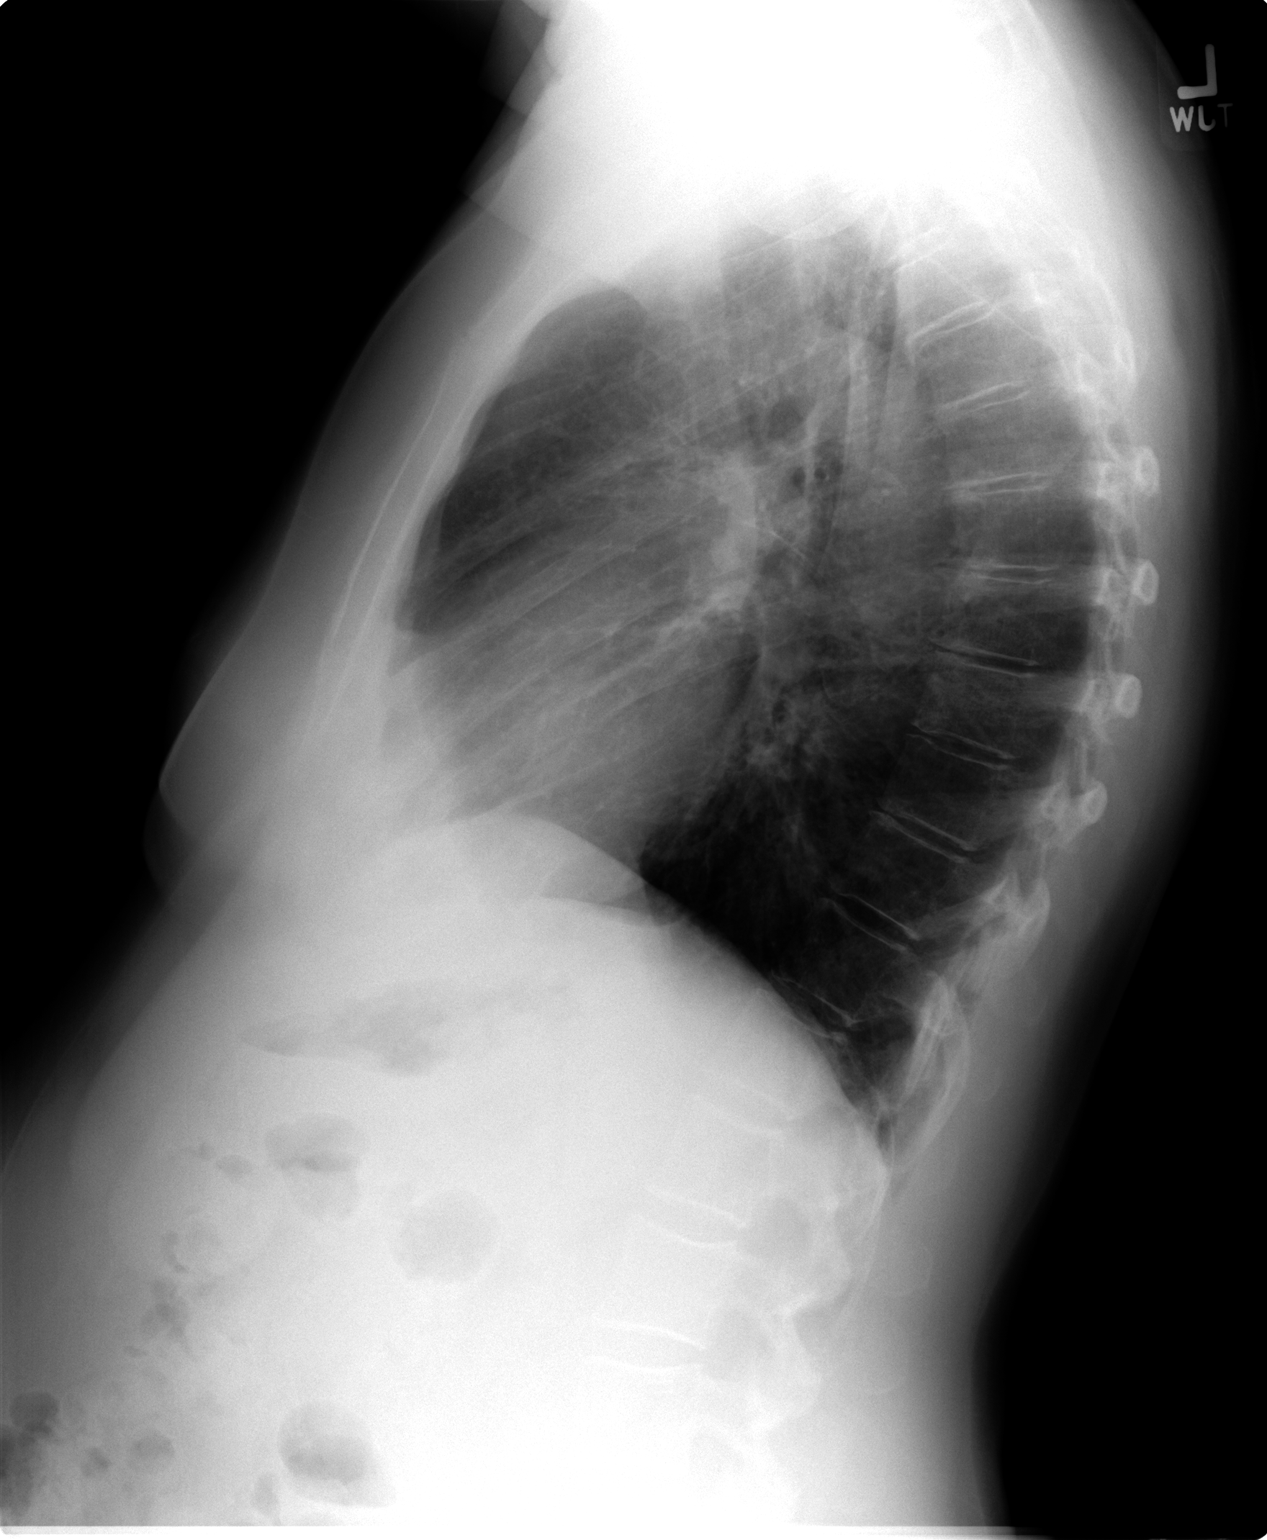

[2 of 2 positions shown; findings below may reference images not displayed]

FINDINGS: The lungs are well-expanded and clear. The heart and pulmonary
vascularity are normal. The mediastinum is normal in width. There is
no pleural effusion. The bony thorax exhibits no acute abnormality.
IMPRESSION: There is no active cardiopulmonary disease.
